# Patient Record
Sex: Female | Born: 1970 | Race: Black or African American | Hispanic: No | Marital: Single | Smoking: Current every day smoker
Health system: Southern US, Community
[De-identification: ages and names within clinical notes are randomized; demographics above are authoritative.]

## PROBLEM LIST (undated history)

## (undated) DIAGNOSIS — E079 Disorder of thyroid, unspecified: Secondary | ICD-10-CM

## (undated) DIAGNOSIS — E1165 Type 2 diabetes mellitus with hyperglycemia: Secondary | ICD-10-CM

## (undated) DIAGNOSIS — D649 Anemia, unspecified: Secondary | ICD-10-CM

## (undated) DIAGNOSIS — C801 Malignant (primary) neoplasm, unspecified: Secondary | ICD-10-CM

## (undated) DIAGNOSIS — R51 Headache: Secondary | ICD-10-CM

## (undated) DIAGNOSIS — I10 Essential (primary) hypertension: Secondary | ICD-10-CM

## (undated) DIAGNOSIS — R519 Headache, unspecified: Secondary | ICD-10-CM

## (undated) HISTORY — PX: OOPHORECTOMY: SHX86

## (undated) HISTORY — PX: TUBAL LIGATION: SHX77

## (undated) HISTORY — DX: Type 2 diabetes mellitus with hyperglycemia: E11.65

## (undated) HISTORY — PX: THYROIDECTOMY: SHX17

## (undated) HISTORY — PX: ABDOMINAL HYSTERECTOMY: SHX81

---

## 2010-07-14 DIAGNOSIS — C73 Malignant neoplasm of thyroid gland: Secondary | ICD-10-CM | POA: Insufficient documentation

## 2016-09-21 ENCOUNTER — Emergency Department (HOSPITAL_BASED_OUTPATIENT_CLINIC_OR_DEPARTMENT_OTHER): Payer: 59

## 2016-09-21 ENCOUNTER — Encounter (HOSPITAL_BASED_OUTPATIENT_CLINIC_OR_DEPARTMENT_OTHER): Payer: Self-pay | Admitting: Emergency Medicine

## 2016-09-21 ENCOUNTER — Emergency Department (HOSPITAL_BASED_OUTPATIENT_CLINIC_OR_DEPARTMENT_OTHER)
Admission: EM | Admit: 2016-09-21 | Discharge: 2016-09-21 | Disposition: A | Discharge status: Home / Self Care | Payer: 59 | Attending: Emergency Medicine | Admitting: Emergency Medicine

## 2016-09-21 DIAGNOSIS — D259 Leiomyoma of uterus, unspecified: Secondary | ICD-10-CM

## 2016-09-21 DIAGNOSIS — Z85818 Personal history of malignant neoplasm of other sites of lip, oral cavity, and pharynx: Secondary | ICD-10-CM | POA: Insufficient documentation

## 2016-09-21 DIAGNOSIS — F17200 Nicotine dependence, unspecified, uncomplicated: Secondary | ICD-10-CM | POA: Insufficient documentation

## 2016-09-21 DIAGNOSIS — Z79899 Other long term (current) drug therapy: Secondary | ICD-10-CM | POA: Insufficient documentation

## 2016-09-21 DIAGNOSIS — R109 Unspecified abdominal pain: Secondary | ICD-10-CM

## 2016-09-21 DIAGNOSIS — R1011 Right upper quadrant pain: Secondary | ICD-10-CM | POA: Diagnosis present

## 2016-09-21 HISTORY — DX: Disorder of thyroid, unspecified: E07.9

## 2016-09-21 HISTORY — DX: Malignant (primary) neoplasm, unspecified: C80.1

## 2016-09-21 HISTORY — DX: Anemia, unspecified: D64.9

## 2016-09-21 LAB — COMPREHENSIVE METABOLIC PANEL
ALK PHOS: 51 U/L (ref 38–126)
ALT: 11 U/L — ABNORMAL LOW (ref 14–54)
AST: 17 U/L (ref 15–41)
Albumin: 3.9 g/dL (ref 3.5–5.0)
Anion gap: 6 (ref 5–15)
BILIRUBIN TOTAL: 0.5 mg/dL (ref 0.3–1.2)
BUN: 7 mg/dL (ref 6–20)
CALCIUM: 8.6 mg/dL — AB (ref 8.9–10.3)
CO2: 25 mmol/L (ref 22–32)
Chloride: 105 mmol/L (ref 101–111)
Creatinine, Ser: 0.77 mg/dL (ref 0.44–1.00)
GFR calc Af Amer: >60 mL/min (ref >60)
Glucose, Bld: 100 mg/dL — ABNORMAL HIGH (ref 65–99)
POTASSIUM: 3.8 mmol/L (ref 3.5–5.1)
Sodium: 136 mmol/L (ref 135–145)
Total Protein: 7.7 g/dL (ref 6.5–8.1)

## 2016-09-21 LAB — CBC WITH DIFFERENTIAL/PLATELET
BASOS ABS: 0 10*3/uL (ref 0.0–0.1)
Basophils Relative: 0 %
Eosinophils Absolute: 0.1 10*3/uL (ref 0.0–0.7)
Eosinophils Relative: 1 %
HEMATOCRIT: 31.7 % — AB (ref 36.0–46.0)
HEMOGLOBIN: 10.2 g/dL — AB (ref 12.0–15.0)
LYMPHS PCT: 15 %
Lymphs Abs: 1.4 10*3/uL (ref 0.7–4.0)
MCH: 25.8 pg — ABNORMAL LOW (ref 26.0–34.0)
MCHC: 32.2 g/dL (ref 30.0–36.0)
MCV: 80.3 fL (ref 78.0–100.0)
MONO ABS: 0.6 10*3/uL (ref 0.1–1.0)
MONOS PCT: 7 %
NEUTROS ABS: 7.2 10*3/uL (ref 1.7–7.7)
Neutrophils Relative %: 77 %
Platelets: 334 10*3/uL (ref 150–400)
RBC: 3.95 MIL/uL (ref 3.87–5.11)
RDW: 20.5 % — AB (ref 11.5–15.5)
WBC: 9.3 10*3/uL (ref 4.0–10.5)

## 2016-09-21 LAB — URINALYSIS, ROUTINE W REFLEX MICROSCOPIC
BILIRUBIN URINE: NEGATIVE
GLUCOSE, UA: NEGATIVE mg/dL
KETONES UR: NEGATIVE mg/dL
LEUKOCYTES UA: NEGATIVE
Nitrite: NEGATIVE
PROTEIN: NEGATIVE mg/dL
Specific Gravity, Urine: 1.012 (ref 1.005–1.030)
pH: 7 (ref 5.0–8.0)

## 2016-09-21 LAB — LIPASE, BLOOD: LIPASE: 23 U/L (ref 11–51)

## 2016-09-21 LAB — URINE MICROSCOPIC-ADD ON

## 2016-09-21 LAB — PREGNANCY, URINE: Preg Test, Ur: NEGATIVE

## 2016-09-21 MED ORDER — FENTANYL CITRATE (PF) 100 MCG/2ML IJ SOLN
50.0000 ug | Freq: Once | INTRAMUSCULAR | Status: AC
  Administered 2016-09-21: 50 ug via INTRAVENOUS
  Filled 2016-09-21: qty 2

## 2016-09-21 MED ORDER — FENTANYL CITRATE (PF) 100 MCG/2ML IJ SOLN
INTRAMUSCULAR | Status: AC
  Administered 2016-09-21: 50 ug
  Filled 2016-09-21: qty 2

## 2016-09-21 MED ORDER — ONDANSETRON HCL 4 MG/2ML IJ SOLN
4.0000 mg | Freq: Once | INTRAMUSCULAR | Status: AC
  Administered 2016-09-21: 4 mg via INTRAVENOUS
  Filled 2016-09-21: qty 2

## 2016-09-21 MED ORDER — HYDROCODONE-ACETAMINOPHEN 5-325 MG PO TABS: 1.0000 | ORAL_TABLET | Freq: Four times a day (QID) | ORAL | 0 refills | Status: DC | PRN

## 2016-09-21 NOTE — ED Triage Notes (Signed)
Low abd pain started yesterday morning and has moved up during the day until now it is upper abdomen.  Denies N/V/D.  Aching pain.

## 2016-09-21 NOTE — ED Provider Notes (Signed)
[facility] Provider Note: Georgena Spurling, MD, FACEP  CSN: [id number] MRN: [id number] ARRIVAL: 09/21/16 at [place]: [name]  Abdominal Pain   HISTORY OF PRESENT ILLNESS  Jenna Nichols is a 45 y.o. female with a history of thyroid cancer status post thyroidectomy in 2002 as well as chronic anemia. She is here with abdominal pain that began yesterday. The pain began diffusely but has localized to the upper abdomen. It is most prominent in the epigastrium and right upper quadrant. She describes it as an ache now but previously it felt like a combination of gas pains and menstrual cramps. She rates it as an 8 out of 10. It is worse standing then lying down. It is worse with movement or palpation. She denies fever, nausea, vomiting, diarrhea, vaginal bleeding, vaginal discharge, dysuria or hematuria. She states she constantly feels cold due to her anemia and admits to eating ice frequently.   Past Medical History:  Diagnosis Date  . Anemia   . Cancer ([place])   . Thyroid disease     Past Surgical History:  Procedure Laterality Date  . THYROIDECTOMY    . TUBAL LIGATION      No family history on file.  Social History  Substance Use Topics  . Smoking status: Current Every Day Smoker    Packs/day: 1.00  . Smokeless tobacco: Never Used  . Alcohol use Yes     Comment: occ    Prior to Admission medications   Medication Sig Start Date End Date Taking? Authorizing Provider  Levothyroxine Sodium (SYNTHROID PO) Take by mouth.   Yes Historical Provider, MD    Allergies Patient has no known allergies.   REVIEW OF SYSTEMS  Negative except as noted here or in the History of Present Illness.   PHYSICAL EXAMINATION  Initial Vital Signs Blood pressure 173/81, pulse 67, temperature 98.4 F (36.9 C), temperature source Oral, resp. rate 16, height 5' 7.25" (1.708 m), weight 165 lb (74.8 kg), last menstrual period 08/30/2016, SpO2 100  %.  Examination General: Well-developed, well-nourished female in no acute distress; appearance consistent with age of record HENT: normocephalic; atraumatic Eyes: pupils equal, round and reactive to light; extraocular muscles intact Neck: supple Heart: regular rate and rhythm Lungs: clear to auscultation bilaterally Abdomen: soft; nondistended; Epigastric and right upper quadrant tenderness; no masses or hepatosplenomegaly; bowel sounds present Extremities: No deformity; full range of motion; pulses normal Neurologic: Awake, alert and oriented; motor function intact in all extremities and symmetric; no facial droop Skin: Warm and dry Psychiatric: Normal mood and affect   RESULTS  Summary of this visit's results, reviewed by myself:   EKG Interpretation  Date/Time:    Ventricular Rate:    PR Interval:    QRS Duration:   QT Interval:    QTC Calculation:   R Axis:     Text Interpretation:        Laboratory Studies: Results for orders placed or performed during the hospital encounter of 09/21/16 (from the past 24 hour(s))  CBC with Differential/Platelet     Status: Abnormal   Collection Time: 09/21/16  2:45 AM  Result Value Ref Range   WBC 9.3 4.0 - 10.5 K/uL   RBC 3.95 3.87 - 5.11 MIL/uL   Hemoglobin 10.2 (L) 12.0 - 15.0 g/dL   HCT 31.7 (L) 36.0 - 46.0 %   MCV 80.3 78.0 - 100.0 fL   MCH 25.8 (L) 26.0 - 34.0 pg   MCHC 32.2 30.0 -  36.0 g/dL   RDW 20.5 (H) 11.5 - 15.5 %   Platelets 334 150 - 400 K/uL   Neutrophils Relative % 77 %   Neutro Abs 7.2 1.7 - 7.7 K/uL   Lymphocytes Relative 15 %   Lymphs Abs 1.4 0.7 - 4.0 K/uL   Monocytes Relative 7 %   Monocytes Absolute 0.6 0.1 - 1.0 K/uL   Eosinophils Relative 1 %   Eosinophils Absolute 0.1 0.0 - 0.7 K/uL   Basophils Relative 0 %   Basophils Absolute 0.0 0.0 - 0.1 K/uL  Comprehensive metabolic panel     Status: Abnormal   Collection Time: 09/21/16  2:45 AM  Result Value Ref Range   Sodium 136 135 - 145 mmol/L    Potassium 3.8 3.5 - 5.1 mmol/L   Chloride 105 101 - 111 mmol/L   CO2 25 22 - 32 mmol/L   Glucose, Bld 100 (H) 65 - 99 mg/dL   BUN 7 6 - 20 mg/dL   Creatinine, Ser 0.77 0.44 - 1.00 mg/dL   Calcium 8.6 (L) 8.9 - 10.3 mg/dL   Total Protein 7.7 6.5 - 8.1 g/dL   Albumin 3.9 3.5 - 5.0 g/dL   AST 17 15 - 41 U/L   ALT 11 (L) 14 - 54 U/L   Alkaline Phosphatase 51 38 - 126 U/L   Total Bilirubin 0.5 0.3 - 1.2 mg/dL   GFR calc non Af Amer >60 >60 mL/min   GFR calc Af Amer >60 >60 mL/min   Anion gap 6 5 - 15  Lipase, blood     Status: None   Collection Time: 09/21/16  2:45 AM  Result Value Ref Range   Lipase 23 11 - 51 U/L  Urinalysis, Routine w reflex microscopic (not at [facility])     Status: Abnormal   Collection Time: 09/21/16  2:50 AM  Result Value Ref Range   Color, Urine YELLOW YELLOW   APPearance CLEAR CLEAR   Specific Gravity, Urine 1.012 1.005 - 1.030   pH 7.0 5.0 - 8.0   Glucose, UA NEGATIVE NEGATIVE mg/dL   Hgb urine dipstick MODERATE (A) NEGATIVE   Bilirubin Urine NEGATIVE NEGATIVE   Ketones, ur NEGATIVE NEGATIVE mg/dL   Protein, ur NEGATIVE NEGATIVE mg/dL   Nitrite NEGATIVE NEGATIVE   Leukocytes, UA NEGATIVE NEGATIVE  Pregnancy, urine     Status: None   Collection Time: 09/21/16  2:50 AM  Result Value Ref Range   Preg Test, Ur NEGATIVE NEGATIVE  Urine microscopic-add on     Status: Abnormal   Collection Time: 09/21/16  2:50 AM  Result Value Ref Range   Squamous Epithelial / LPF 0-5 (A) NONE SEEN   WBC, UA 0-5 0 - 5 WBC/hpf   RBC / HPF 6-30 0 - 5 RBC/hpf   Bacteria, UA RARE (A) NONE SEEN   Imaging Studies: Ct Renal Stone Study  Result Date: 09/21/2016 CLINICAL DATA:  Diffuse abdominal pain for 17 hours.  Hematuria. EXAM: CT ABDOMEN AND PELVIS WITHOUT CONTRAST TECHNIQUE: Multidetector CT imaging of the abdomen and pelvis was performed following the standard protocol without IV contrast. COMPARISON:  None. FINDINGS: Lower chest: Mild dependent changes in the lung bases.  Hepatobiliary: No focal liver abnormality is seen. No gallstones, gallbladder wall thickening, or biliary dilatation. Pancreas: Unremarkable. No pancreatic ductal dilatation or surrounding inflammatory changes. Spleen: Normal in size without focal abnormality. Adrenals/Urinary Tract: No adrenal gland nodules. No hydronephrosis or hydroureter. No renal or ureteral stones. Bladder is decompressed, limiting evaluation  but no specific filling defect or wall thickening is appreciated. Stomach/Bowel: Stomach is within normal limits. Appendix appears normal. No evidence of bowel wall thickening, distention, or inflammatory changes. Vascular/Lymphatic: No significant vascular findings are present. No enlarged abdominal or pelvic lymph nodes. Reproductive: Nodular uterine enlargement consistent with uterine fibroids. No abnormal adnexal masses. Other: No abdominal wall hernia or abnormality. No abdominopelvic ascites. Musculoskeletal: Degenerative changes in the spine. No destructive bone lesions. Partial compression of L1 vertebra, appears chronic. IMPRESSION: No renal or ureteral stone or obstruction demonstrated. No acute process demonstrated in the abdomen and pelvis on noncontrast imaging. Electronically Signed   By: Lucienne Capers M.D.   On: 09/21/2016 03:50    ED COURSE  Nursing notes and initial vitals signs, including pulse oximetry, reviewed.  Vitals:   09/21/16 0217 09/21/16 0218  BP: 173/81   Pulse: 67   Resp: 16   Temp: 98.4 F (36.9 C)   TempSrc: Oral   SpO2: 100%   Weight:  165 lb (74.8 kg)  Height:  5' 7.25" (1.708 m)   3:22 AM Patient's right upper quadrant pain and tenderness have abated. She is now having pain and tenderness in her right lower quadrant and suprapubic region.  5:05 AM Patient is pain-free at this time. There is no tenderness of the abdomen. She was advised of her laboratory and CT findings. The blood in your urine may represent a passed stone but may be contamination  from early menses. She has a history of dysmenorrhea and was unaware of the presence of uterine fibroids. She is requesting referral to an OB/GYN.  PROCEDURES    ED DIAGNOSES     ICD-9-CM ICD-10-CM   1. Abdominal pain in female 789.00 R10.9   2. Uterine leiomyoma, unspecified location 218.9 D25.9        Shanon Rosser, MD 09/21/16 [phone]

## 2016-09-21 NOTE — ED Notes (Signed)
ED Provider at bedside. 

## 2016-10-17 ENCOUNTER — Ambulatory Visit (INDEPENDENT_AMBULATORY_CARE_PROVIDER_SITE_OTHER): Payer: 59 | Admitting: Family Medicine

## 2016-10-17 ENCOUNTER — Ambulatory Visit (HOSPITAL_BASED_OUTPATIENT_CLINIC_OR_DEPARTMENT_OTHER)
Admission: RE | Admit: 2016-10-17 | Discharge: 2016-10-17 | Disposition: A | Discharge status: Home / Self Care | Payer: 59 | Source: Ambulatory Visit | Attending: Family Medicine | Admitting: Family Medicine

## 2016-10-17 ENCOUNTER — Other Ambulatory Visit (HOSPITAL_BASED_OUTPATIENT_CLINIC_OR_DEPARTMENT_OTHER): Payer: 59

## 2016-10-17 ENCOUNTER — Encounter: Payer: Self-pay | Admitting: Family Medicine

## 2016-10-17 VITALS — BP 148/88 | HR 88 | Ht 65.0 in | Wt 175.0 lb

## 2016-10-17 DIAGNOSIS — D259 Leiomyoma of uterus, unspecified: Secondary | ICD-10-CM | POA: Insufficient documentation

## 2016-10-17 DIAGNOSIS — I10 Essential (primary) hypertension: Secondary | ICD-10-CM | POA: Diagnosis not present

## 2016-10-17 NOTE — Progress Notes (Signed)
   Subjective:    Patient ID: Jenna Nichols, female    DOB: 08/21/1971, 45 y.o.   MRN: [id number]  HPI Patient referred to our office by [facility] ED for fibroids and dysmenorrhea. Patient has periods every 28-30 days. Flow lasts for 5 days with 2 days of heavy flow, during which she uses both a tampon and a heavy pad. She usually has severe dysmenorrhea with her menses. She is sexually active. No abnormal discharge. Denies pelvic pain outside of menses. No palliating or provoking factors - takes NSAIDs, which help.   I have reviewed the patients past medical, family, and social history.  I have reviewed the patient's medication list and allergies.   Review of Systems  All other systems reviewed and are negative.      Objective:   Physical Exam  Constitutional: She is oriented to person, place, and time. She appears well-developed and well-nourished.  HENT:  Head: Normocephalic and atraumatic.  Right Ear: External ear normal.  Left Ear: External ear normal.  Cardiovascular: Normal rate, regular rhythm and normal heart sounds.  Exam reveals no gallop and no friction rub.   No murmur heard. Pulmonary/Chest: Effort normal and breath sounds normal. No respiratory distress. She has no wheezes. She has no rales. She exhibits no tenderness.  Abdominal: Soft. She exhibits no distension. There is no tenderness. There is no rebound. Hernia confirmed negative in the right inguinal area and confirmed negative in the left inguinal area.  Genitourinary: There is no rash, tenderness, lesion or injury on the right labia. There is no rash, tenderness, lesion or injury on the left labia. Uterus is enlarged (about 10 week size). Cervix exhibits no motion tenderness, no discharge and no friability. Right adnexum displays no mass, no tenderness and no fullness. Left adnexum displays no mass, no tenderness and no fullness. No erythema, tenderness or bleeding in the vagina. No foreign body in the vagina. No signs of  injury around the vagina. No vaginal discharge found.  Neurological: She is alert and oriented to person, place, and time.      Assessment & Plan:  1. Uterine leiomyoma, unspecified location Will check [place]. Patient to return in 2-3 weeks for follow up of [place] and to discuss plan. Will do annual exam at that time. - US Pelvis Complete; Future - US Transvaginal Non-OB; Future  2. Essential hypertension If elevated at next appt, should start medication.

## 2016-11-08 ENCOUNTER — Ambulatory Visit (INDEPENDENT_AMBULATORY_CARE_PROVIDER_SITE_OTHER): Payer: 59 | Admitting: Family Medicine

## 2016-11-08 ENCOUNTER — Encounter: Payer: Self-pay | Admitting: Family Medicine

## 2016-11-08 VITALS — BP 150/92 | Ht 65.0 in | Wt 178.0 lb

## 2016-11-08 DIAGNOSIS — Z0000 Encounter for general adult medical examination without abnormal findings: Secondary | ICD-10-CM | POA: Diagnosis not present

## 2016-11-08 DIAGNOSIS — Z1151 Encounter for screening for human papillomavirus (HPV): Secondary | ICD-10-CM | POA: Diagnosis not present

## 2016-11-08 DIAGNOSIS — I10 Essential (primary) hypertension: Secondary | ICD-10-CM

## 2016-11-08 DIAGNOSIS — Z124 Encounter for screening for malignant neoplasm of cervix: Secondary | ICD-10-CM | POA: Diagnosis not present

## 2016-11-08 DIAGNOSIS — D259 Leiomyoma of uterus, unspecified: Secondary | ICD-10-CM

## 2016-11-08 DIAGNOSIS — Z01419 Encounter for gynecological examination (general) (routine) without abnormal findings: Secondary | ICD-10-CM | POA: Diagnosis not present

## 2016-11-08 MED ORDER — MEGESTROL ACETATE 40 MG PO TABS: 80.0000 mg | ORAL_TABLET | Freq: Every day | ORAL | 5 refills | Status: DC

## 2016-11-08 MED ORDER — ENALAPRIL MALEATE 10 MG PO TABS: 10.0000 mg | ORAL_TABLET | Freq: Every day | ORAL | 3 refills | Status: DC

## 2016-11-08 NOTE — Patient Instructions (Signed)

## 2016-11-08 NOTE — Addendum Note (Signed)
Addended by: Lyndal Rainbow on: 11/08/2016 09:17 AM   Modules accepted: Orders

## 2016-11-08 NOTE — Progress Notes (Signed)
Subjective:     Jenna Nichols is a 45 y.o. female and is here for a comprehensive physical exam. The patient reports no problems.  Social History   Social History  . Marital status: Single    Spouse name: N/A  . Number of children: N/A  . Years of education: N/A   Occupational History  . Not on file.   Social History Main Topics  . Smoking status: Current Every Day Smoker    Packs/day: 1.00  . Smokeless tobacco: Never Used  . Alcohol use Yes     Comment: occ  . Drug use:     Types: Marijuana     Comment: Sts she smokes Marijuana "every day"  . Sexual activity: Not Currently    Birth control/ protection: Surgical   Other Topics Concern  . Not on file   Social History Narrative  . No narrative on file   Health Maintenance  Topic Date Due  . HIV Screening  09/17/1984  . TETANUS/TDAP  09/17/1988  . PAP SMEAR  09/17/1990  . INFLUENZA VACCINE  06/12/2016    The following portions of the patient's history were reviewed and updated as appropriate: allergies, current medications, past family history, past medical history, past social history, past surgical history and problem list.  Review of Systems Pertinent items are noted in HPI.   Objective:   General Appearance:    Alert, cooperative, no distress, appears stated age  Head:    Normocephalic, without obvious abnormality, atraumatic  Eyes:    PERRL, conjunctiva/corneas clear, EOM's intact, fundi    benign, both eyes  Neck:   Supple, symmetrical, trachea midline, no adenopathy;    thyroid:  no enlargement/tenderness/nodules; no carotid   bruit or JVD  Back:     Symmetric, no curvature, ROM normal, no CVA tenderness  Lungs:     Clear to auscultation bilaterally, respirations unlabored  Chest Wall:    No tenderness or deformity   Heart:    Regular rate and rhythm, S1 and S2 normal, no murmur, rub   or gallop  Breast Exam:    No tenderness, masses, or nipple abnormality  Abdomen:     Soft, non-tender, bowel sounds  active all four quadrants,    no masses, no organomegaly  Genitalia:    Normal female without lesion, discharge or tenderness.         Vaginal rugae normal.  Cervix normal in appearance.  Uterus   normal in size.  Adnexa normal, no masses or fullness   palpated.  Extremities:   Extremities normal, atraumatic, no cyanosis or edema  Pulses:   2+ and symmetric all extremities  Skin:   Skin color, texture, turgor normal, no rashes or lesions  Lymph nodes:   Cervical, supraclavicular, and axillary nodes normal  Neurologic:   CNII-XII intact, normal strength, sensation and reflexes    throughout     Assessment:       Plan:      1. Well woman exam with routine gynecological exam PAP done. Discussed diet, exercise - MM DIGITAL SCREENING BILATERAL; Future  2. Uterine leiomyoma, unspecified location Discussed options. Patient would like hysterectomy. Will have pt seen [employer] officer, [employer]. Start on megace to control bleeding until patient able to be seen.  3. Essential hypertension Start vasotec. Discussed smoking cessation, dietary and lifestyle modifications.  See After Visit Summary for Counseling Recommendations

## 2016-11-13 ENCOUNTER — Ambulatory Visit (HOSPITAL_BASED_OUTPATIENT_CLINIC_OR_DEPARTMENT_OTHER)
Admission: RE | Admit: 2016-11-13 | Discharge: 2016-11-13 | Disposition: A | Discharge status: Home / Self Care | Payer: 59 | Source: Ambulatory Visit | Attending: Family Medicine | Admitting: Family Medicine

## 2016-11-13 DIAGNOSIS — Z01419 Encounter for gynecological examination (general) (routine) without abnormal findings: Secondary | ICD-10-CM

## 2016-11-13 DIAGNOSIS — Z1231 Encounter for screening mammogram for malignant neoplasm of breast: Secondary | ICD-10-CM | POA: Insufficient documentation

## 2016-11-13 LAB — CYTOLOGY - PAP
ADEQUACY: ABSENT
DIAGNOSIS: NEGATIVE
HPV: NOT DETECTED

## 2016-11-14 ENCOUNTER — Other Ambulatory Visit: Payer: Self-pay | Admitting: Family Medicine

## 2016-11-14 DIAGNOSIS — R928 Other abnormal and inconclusive findings on diagnostic imaging of breast: Secondary | ICD-10-CM

## 2016-11-14 MED ORDER — METRONIDAZOLE 500 MG PO TABS: 500.0000 mg | ORAL_TABLET | Freq: Two times a day (BID) | ORAL | 0 refills | Status: DC

## 2016-11-15 ENCOUNTER — Telehealth: Payer: Self-pay

## 2016-11-15 NOTE — Telephone Encounter (Signed)
Left message for patient to return call to office.  Jennifer Howard RNBSN 

## 2016-11-15 NOTE — Telephone Encounter (Signed)
-----   Message from Truett Mainland, DO sent at 11/14/2016  8:27 AM EST ----- Has trichomonas. Please let her know. Flagyl sent to pharmacy: 500mg  BID x7 days. Partner needs treatment as well.

## 2016-11-19 ENCOUNTER — Other Ambulatory Visit: Payer: 59

## 2016-11-22 ENCOUNTER — Other Ambulatory Visit: Payer: Self-pay | Admitting: Family Medicine

## 2016-11-22 ENCOUNTER — Ambulatory Visit
Admission: RE | Admit: 2016-11-22 | Discharge: 2016-11-22 | Disposition: A | Discharge status: Home / Self Care | Payer: 59 | Source: Ambulatory Visit | Attending: Family Medicine | Admitting: Family Medicine

## 2016-11-22 DIAGNOSIS — R599 Enlarged lymph nodes, unspecified: Secondary | ICD-10-CM

## 2016-11-22 DIAGNOSIS — N6310 Unspecified lump in the right breast, unspecified quadrant: Secondary | ICD-10-CM

## 2016-11-22 DIAGNOSIS — R928 Other abnormal and inconclusive findings on diagnostic imaging of breast: Secondary | ICD-10-CM

## 2016-11-23 ENCOUNTER — Institutional Professional Consult (permissible substitution): Payer: 59 | Admitting: Obstetrics & Gynecology

## 2016-11-23 DIAGNOSIS — Z01419 Encounter for gynecological examination (general) (routine) without abnormal findings: Secondary | ICD-10-CM

## 2016-11-29 ENCOUNTER — Inpatient Hospital Stay: Admission: RE | Admit: 2016-11-29 | Payer: 59 | Source: Ambulatory Visit

## 2016-11-29 ENCOUNTER — Other Ambulatory Visit: Payer: 59

## 2016-12-03 ENCOUNTER — Other Ambulatory Visit: Payer: Self-pay | Admitting: Family Medicine

## 2016-12-03 ENCOUNTER — Ambulatory Visit
Admission: RE | Admit: 2016-12-03 | Discharge: 2016-12-03 | Disposition: A | Discharge status: Home / Self Care | Payer: 59 | Source: Ambulatory Visit | Attending: Family Medicine | Admitting: Family Medicine

## 2016-12-03 DIAGNOSIS — R599 Enlarged lymph nodes, unspecified: Secondary | ICD-10-CM

## 2016-12-03 DIAGNOSIS — N6310 Unspecified lump in the right breast, unspecified quadrant: Secondary | ICD-10-CM

## 2017-06-11 ENCOUNTER — Ambulatory Visit: Payer: 59 | Admitting: Certified Nurse Midwife

## 2017-06-11 ENCOUNTER — Encounter: Payer: Self-pay | Admitting: General Practice

## 2017-06-11 NOTE — Progress Notes (Unsigned)
Patient no showed for appt. Can reschedule on her own

## 2017-11-15 ENCOUNTER — Encounter: Payer: Self-pay | Admitting: Family Medicine

## 2017-11-15 ENCOUNTER — Ambulatory Visit (INDEPENDENT_AMBULATORY_CARE_PROVIDER_SITE_OTHER): Payer: 59 | Admitting: Family Medicine

## 2017-11-15 VITALS — BP 156/90 | HR 82 | Ht 67.0 in | Wt 175.0 lb

## 2017-11-15 DIAGNOSIS — Z113 Encounter for screening for infections with a predominantly sexual mode of transmission: Secondary | ICD-10-CM

## 2017-11-15 DIAGNOSIS — N946 Dysmenorrhea, unspecified: Secondary | ICD-10-CM

## 2017-11-15 MED ORDER — NORETHINDRONE 0.35 MG PO TABS
1.0000 | ORAL_TABLET | Freq: Every day | ORAL | 11 refills | Status: DC
Start: 2017-11-15 — End: 2018-06-17

## 2017-11-15 MED ORDER — IBUPROFEN 800 MG PO TABS: 800.0000 mg | ORAL_TABLET | Freq: Three times a day (TID) | ORAL | 3 refills | Status: DC | PRN

## 2017-11-15 MED FILL — IBUPROFEN 800 MG TAB: 800 | 10 days supply | Qty: 30 | Fill #0

## 2017-11-15 MED FILL — NORETHINDRONE 0.35 MG TAB: 0.35 | 28 days supply | Qty: 28 | Fill #0

## 2017-11-15 NOTE — Progress Notes (Signed)
Patient complaining of lower abdominal pain before her period. Kathrene Alu RN

## 2017-11-15 NOTE — Progress Notes (Signed)
   Subjective:    Patient ID: Jenna Nichols, female    DOB: [birth year], 47 y.o.   MRN: [id number]  HPI Patient seen for pelvic cramping that start about a week prior to the start of her menses. Started about 2-3 months ago. Initially started 1-2 days prior to start of menses, but has increased to 7 days prior. Had [place] last year, showing 3 intramural fibroids. 2-4cm in size. Menses a little heavier and longer.  Also wants to be tested for STDs.  Review of Systems     Objective:   Physical Exam  Constitutional: She appears well-developed and well-nourished.  Abdominal: Hernia confirmed negative in the right inguinal area and confirmed negative in the left inguinal area.  Genitourinary: There is no rash, tenderness, lesion or injury on the right labia. There is no rash, tenderness, lesion or injury on the left labia. Uterus is enlarged (6wk size). Uterus is not deviated, not fixed and not tender. Cervix exhibits no motion tenderness, no discharge and no friability. Right adnexum displays no mass and no tenderness. Left adnexum displays no mass, no tenderness and no fullness. No erythema, tenderness or bleeding in the vagina. No foreign body in the vagina. No signs of injury around the vagina. No vaginal discharge found.  Lymphadenopathy:       Right: No inguinal adenopathy present.       Left: No inguinal adenopathy present.      Assessment & Plan:  1. Dysmenorrhea Discussed treatment options - NSAIDs, progesterone, vs surgery. Patient not at a point where she can afford to take time off for surgery. Will treat with NSAIDs and progesterone - will start with micronor and increase if needed.  2. Screening for STD (sexually transmitted disease) Patient having blood work done at endocrinology next week. Lab slip sent for HIV, HepB/C, RPR. - Cervicovaginal ancillary only

## 2017-11-19 ENCOUNTER — Other Ambulatory Visit: Payer: Self-pay | Admitting: Family Medicine

## 2017-11-19 ENCOUNTER — Telehealth: Payer: Self-pay

## 2017-11-19 LAB — CERVICOVAGINAL ANCILLARY ONLY
Bacterial vaginitis: POSITIVE — AB
CANDIDA VAGINITIS: NEGATIVE
Chlamydia: NEGATIVE
NEISSERIA GONORRHEA: NEGATIVE
TRICH (WINDOWPATH): POSITIVE — AB

## 2017-11-19 MED ORDER — METRONIDAZOLE 500 MG PO TABS: 500.0000 mg | ORAL_TABLET | Freq: Two times a day (BID) | ORAL | 0 refills | Status: DC

## 2017-11-19 NOTE — Telephone Encounter (Signed)
Patient called and made aware that she has bacterial vaginosis and trichomonas. Patient made aware that she has an antibiotic for treatment at her pharmacy. Made patient aware that she needs to avoid alcohol with taking this prescription. Patient also made aware that partner/ vibrators needs to be tested and treated/sterilized before having intercourse.   Patient states understanding. Jenna Nichols RNBSN

## 2017-11-19 NOTE — Telephone Encounter (Signed)
-----   Message from Truett Mainland, DO sent at 11/19/2017  4:23 PM EST ----- Pt has BV and Trich. No sex for 2 weeks. Partners need to be treated and vibrators, etc need to be sterilized. Flagyl sent to pharmacy: 500mg  BID x 7 days. Please finish antibiotics and don't drink alcohol while on antibiotics. Please notify patient.

## 2017-11-21 DIAGNOSIS — C73 Malignant neoplasm of thyroid gland: Secondary | ICD-10-CM | POA: Diagnosis not present

## 2017-11-21 DIAGNOSIS — D508 Other iron deficiency anemias: Secondary | ICD-10-CM | POA: Diagnosis not present

## 2018-05-02 ENCOUNTER — Encounter: Payer: Self-pay | Admitting: Obstetrics & Gynecology

## 2018-05-02 ENCOUNTER — Other Ambulatory Visit (HOSPITAL_BASED_OUTPATIENT_CLINIC_OR_DEPARTMENT_OTHER): Payer: 59

## 2018-05-02 ENCOUNTER — Ambulatory Visit (INDEPENDENT_AMBULATORY_CARE_PROVIDER_SITE_OTHER): Payer: 59 | Admitting: Obstetrics & Gynecology

## 2018-05-02 VITALS — BP 155/95 | HR 100 | Ht 67.25 in | Wt 190.0 lb

## 2018-05-02 DIAGNOSIS — Z113 Encounter for screening for infections with a predominantly sexual mode of transmission: Secondary | ICD-10-CM | POA: Diagnosis not present

## 2018-05-02 DIAGNOSIS — F17200 Nicotine dependence, unspecified, uncomplicated: Secondary | ICD-10-CM | POA: Diagnosis not present

## 2018-05-02 DIAGNOSIS — R102 Pelvic and perineal pain: Secondary | ICD-10-CM | POA: Diagnosis not present

## 2018-05-02 DIAGNOSIS — N921 Excessive and frequent menstruation with irregular cycle: Secondary | ICD-10-CM | POA: Diagnosis not present

## 2018-05-02 DIAGNOSIS — D259 Leiomyoma of uterus, unspecified: Secondary | ICD-10-CM

## 2018-05-02 DIAGNOSIS — N946 Dysmenorrhea, unspecified: Secondary | ICD-10-CM

## 2018-05-02 NOTE — Progress Notes (Signed)
Patient states she knows she has fibroids but is now having cramping daily. Her periods are also really heavy. Kathrene Alu RN

## 2018-05-02 NOTE — Progress Notes (Signed)
Patient ID: Jenna Nichols, female   DOB: 01-08-1971, 47 y.o.   MRN: [id number]  Ccz; daily cramps, frequent heavy menses  HPI Jenna Nichols is a 47 y.o. female.  G3P3 No LMP recorded. She returns today noting no improvement with her cramps, heavy bleeding and her menses come every 3-4 weeks and last for 4-6 days. Ibuprofen does relieve her pain but she takes it daily. HPI  Past Medical History:  Diagnosis Date  . Anemia   . Cancer ([place])   . Thyroid disease     Past Surgical History:  Procedure Laterality Date  . THYROIDECTOMY    . TUBAL LIGATION      Family History  Problem Relation Age of Onset  . Diabetes Mother   . Cancer Brother     Social History Social History   Tobacco Use  . Smoking status: Current Every Day Smoker    Packs/day: 1.00  . Smokeless tobacco: Never Used  Substance Use Topics  . Alcohol use: Yes    Comment: occ  . Drug use: Yes    Types: Marijuana    Comment: Sts she smokes Marijuana "every day"    No Known Allergies  Current Outpatient Medications  Medication Sig Dispense Refill  . ibuprofen (ADVIL,MOTRIN) 800 MG tablet Take 1 tablet (800 mg total) by mouth 3 (three) times daily with meals as needed for headache or moderate pain. 30 tablet 3  . Levothyroxine Sodium (SYNTHROID PO) Take 137 mcg by mouth.     . enalapril (VASOTEC) 10 MG tablet Take 1 tablet (10 mg total) by mouth daily. (Patient not taking: Reported on 11/15/2017) 90 tablet 3  . HYDROcodone-acetaminophen (NORCO) 5-325 MG tablet Take 1 tablet by mouth every 6 (six) hours as needed for severe pain. (Patient not taking: Reported on 11/08/2016) 6 tablet 0  . megestrol (MEGACE) 40 MG tablet Take 2 tablets (80 mg total) by mouth daily. Can increase to two tablets twice a day in the event of heavy bleeding (Patient not taking: Reported on 11/15/2017) 60 tablet 5  . metroNIDAZOLE (FLAGYL) 500 MG tablet Take 1 tablet (500 mg total) by mouth 2 (two) times daily. (Patient not taking: Reported on  05/02/2018) 14 tablet 0  . norethindrone (MICRONOR,CAMILA,ERRIN) 0.35 MG tablet Take 1 tablet (0.35 mg total) by mouth daily. (Patient not taking: Reported on 05/02/2018) 1 Package 11   No current facility-administered medications for this visit.     Review of Systems Review of Systems  Constitutional: Positive for fatigue.  Respiratory: Negative.   Cardiovascular: Negative.   Gastrointestinal: Positive for abdominal distention and abdominal pain.  Genitourinary: Positive for menstrual problem and pelvic pain. Negative for vaginal discharge.    Blood pressure (!) 155/95, pulse 100, height 5' 7.25" (1.708 m), weight 190 lb (86.2 kg).  Physical Exam Physical Exam  Constitutional: She appears well-developed. No distress.  HENT:  Head: Normocephalic.  Pulmonary/Chest: Effort normal.  Psychiatric: She has a normal mood and affect. Her behavior is normal.    Data Reviewed CLINICAL DATA:  Patient with history of fibroids. Heavy menstrual cycles.  EXAM: TRANSABDOMINAL AND TRANSVAGINAL ULTRASOUND OF PELVIS  TECHNIQUE: Both transabdominal and transvaginal ultrasound examinations of the pelvis were performed. Transabdominal technique was performed for global imaging of the pelvis including uterus, ovaries, adnexal regions, and pelvic cul-de-sac. It was necessary to proceed with endovaginal exam following the transabdominal exam to visualize the endometrium.  COMPARISON:  None  FINDINGS: Uterus  Measurements: 11.4 x 6.7 x 7.6 cm. No fibroids  or other mass visualized.  Endometrium  Thickness: 1.2 mm. Multiple fibroids within the uterus including a 3.5 x 3.8 x 3.6 cm intramural fibroid within the anterior uterine body, a 2.4 x 2.4 x 2.5 cm intramural fibroid within the posterior uterine body and a 1.6 x 1.5 x 1.7 cm intramural fibroid within the uterine fundus.  Right ovary  Measurements: 3.5 x 2.2 x 2.6 cm. Normal appearance/no adnexal mass.  Left  ovary  Measurements: 3.2 x 2.1 x 2.7 cm. Within the left ovary there is a 1.2 x 1.1 x 1.3 cm probable complicated/ hemorrhagic cyst.  Other findings  No abnormal free fluid.  IMPRESSION: Multiple uterine fibroids.  Probable hemorrhagic cyst within the left ovary. Short-interval follow up ultrasound in 6-12 weeks is recommended, preferably during the week following the patient's normal menses.   Electronically Signed   By: Lovey Newcomer M.D.   On: 10/18/2016 08:23  CBC    Component Value Date/Time   WBC 9.3 09/21/2016 0245   RBC 3.95 09/21/2016 0245   HGB 10.2 (L) 09/21/2016 0245   HCT 31.7 (L) 09/21/2016 0245   PLT 334 09/21/2016 0245   MCV 80.3 09/21/2016 0245   MCH 25.8 (L) 09/21/2016 0245   MCHC 32.2 09/21/2016 0245   RDW 20.5 (H) 09/21/2016 0245   LYMPHSABS 1.4 09/21/2016 0245   MONOABS 0.6 09/21/2016 0245   EOSABS 0.1 09/21/2016 0245   BASOSABS 0.0 09/21/2016 0245      Assessment    Patient Active Problem List   Diagnosis Date Noted  . Dysmenorrhea 05/02/2018  . Menometrorrhagia 05/02/2018  . Uterine leiomyoma 10/17/2016  . Essential hypertension 10/17/2016  . Malignant neoplasm of thyroid gland ([place]) 07/14/2010  Smoker, urged to quit, spent 5 min discussing cessation      Plan    Repeat pelvic US, CBC, STD testing Continue ibuprofen prn Consider ablation, Mirena, or TVH RTC 4 weeks    Quit smoking information   Emeterio Reeve 05/02/2018, 9:07 AM

## 2018-05-02 NOTE — Patient Instructions (Addendum)
Uterine Fibroids Uterine fibroids are tissue masses (tumors) that can develop in the womb (uterus). They are also called leiomyomas. This type of tumor is not cancerous (benign) and does not spread to other parts of the body outside of the pelvic area, which is between the hip bones. Occasionally, fibroids may develop in the fallopian tubes, in the cervix, or on the support structures (ligaments) that surround the uterus. You can have one or many fibroids. Fibroids can vary in size, weight, and where they grow in the uterus. Some can become quite large. Most fibroids do not require medical treatment. What are the causes? A fibroid can develop when a single uterine cell keeps growing (replicating). Most cells in the human body have a control mechanism that keeps them from replicating without control. What are the signs or symptoms? Symptoms may include:  Heavy bleeding during your period.  Bleeding or spotting between periods.  Pelvic pain and pressure.  Bladder problems, such as needing to urinate more often (urinary frequency) or urgently.  Inability to reproduce offspring (infertility).  Miscarriages.  How is this diagnosed? Uterine fibroids are diagnosed through a physical exam. Your health care provider may feel the lumpy tumors during a pelvic exam. Ultrasonography and an MRI may be done to determine the size, location, and number of fibroids. How is this treated? Treatment may include:  Watchful waiting. This involves getting the fibroid checked by your health care provider to see if it grows or shrinks. Follow your health care provider's recommendations for how often to have this checked.  Hormone medicines. These can be taken by mouth or given through an intrauterine device (IUD).  Surgery. ? Removing the fibroids (myomectomy) or the uterus (hysterectomy). ? Removing blood supply to the fibroids (uterine artery embolization).  If fibroids interfere with your fertility and you  want to become pregnant, your health care provider may recommend having the fibroids removed. Follow these instructions at home:  Keep all follow-up visits as directed by your health care provider. This is important.  Take over-the-counter and prescription medicines only as told by your health care provider. ? If you were prescribed a hormone treatment, take the hormone medicines exactly as directed.  Ask your health care provider about taking iron pills and increasing the amount of dark green, leafy vegetables in your diet. These actions can help to boost your blood iron levels, which may be affected by heavy menstrual bleeding.  Pay close attention to your period and tell your health care provider about any changes, such as: ? Increased blood flow that requires you to use more pads or tampons than usual per month. ? A change in the number of days that your period lasts per month. ? A change in symptoms that are associated with your period, such as abdominal cramping or back pain. Contact a health care provider if:  You have pelvic pain, back pain, or abdominal cramps that cannot be controlled with medicines.  You have an increase in bleeding between and during periods.  You soak tampons or pads in a half hour or less.  You feel lightheaded, extra tired, or weak. Get help right away if:  You faint.  You have a sudden increase in pelvic pain. This information is not intended to replace advice given to you by your health care provider. Make sure you discuss any questions you have with your health care provider. Document Released: 10/26/2000 Document Revised: 06/28/2016 Document Reviewed: 04/27/2014 Elsevier Interactive Patient Education  2018 Elsevier Inc.    Steps to Quit Smoking Smoking tobacco can be bad for your health. It can also affect almost every organ in your body. Smoking puts you and people around you at risk for many serious long-lasting (chronic) diseases. Quitting  smoking is hard, but it is one of the best things that you can do for your health. It is never too late to quit. What are the benefits of quitting smoking? When you quit smoking, you lower your risk for getting serious diseases and conditions. They can include:  Lung cancer or lung disease.  Heart disease.  Stroke.  Heart attack.  Not being able to have children (infertility).  Weak bones (osteoporosis) and broken bones (fractures).  If you have coughing, wheezing, and shortness of breath, those symptoms may get better when you quit. You may also get sick less often. If you are pregnant, quitting smoking can help to lower your chances of having a baby of low birth weight. What can I do to help me quit smoking? Talk with your doctor about what can help you quit smoking. Some things you can do (strategies) include:  Quitting smoking totally, instead of slowly cutting back how much you smoke over a period of time.  Going to in-person counseling. You are more likely to quit if you go to many counseling sessions.  Using resources and support systems, such as: ? Database administrator with a Social worker. ? Phone quitlines. ? Careers information officer. ? Support groups or group counseling. ? Text messaging programs. ? Mobile phone apps or applications.  Taking medicines. Some of these medicines may have nicotine in them. If you are pregnant or breastfeeding, do not take any medicines to quit smoking unless your doctor says it is okay. Talk with your doctor about counseling or other things that can help you.  Talk with your doctor about using more than one strategy at the same time, such as taking medicines while you are also going to in-person counseling. This can help make quitting easier. What things can I do to make it easier to quit? Quitting smoking might feel very hard at first, but there is a lot that you can do to make it easier. Take these steps:  Talk to your family and friends. Ask  them to support and encourage you.  Call phone quitlines, reach out to support groups, or work with a Social worker.  Ask people who smoke to not smoke around you.  Avoid places that make you want (trigger) to smoke, such as: ? Bars. ? Parties. ? Smoke-break areas at work.  Spend time with people who do not smoke.  Lower the stress in your life. Stress can make you want to smoke. Try these things to help your stress: ? Getting regular exercise. ? Deep-breathing exercises. ? Yoga. ? Meditating. ? Doing a body scan. To do this, close your eyes, focus on one area of your body at a time from head to toe, and notice which parts of your body are tense. Try to relax the muscles in those areas.  Download or buy apps on your mobile phone or tablet that can help you stick to your quit plan. There are many free apps, such as QuitGuide from the [employer] Office manager for Disease Control and Prevention). You can find more support from smokefree.gov and other websites.  This information is not intended to replace advice given to you by your health care provider. Make sure you discuss any questions you have with your health care provider. Document Released: 08/25/2009  Document Revised: 06/26/2016 Document Reviewed: 03/15/2015 Elsevier Interactive Patient Education  [employer].

## 2018-05-03 LAB — CBC
HEMATOCRIT: 26.2 % — AB (ref 34.0–46.6)
Hemoglobin: 7.8 g/dL — ABNORMAL LOW (ref 11.1–15.9)
MCH: 20.9 pg — ABNORMAL LOW (ref 26.6–33.0)
MCHC: 29.8 g/dL — AB (ref 31.5–35.7)
MCV: 70 fL — ABNORMAL LOW (ref 79–97)
Platelets: 422 10*3/uL (ref 150–450)
RBC: 3.73 x10E6/uL — ABNORMAL LOW (ref 3.77–5.28)
RDW: 22.3 % — AB (ref 12.3–15.4)
WBC: 5.4 10*3/uL (ref 3.4–10.8)

## 2018-05-06 ENCOUNTER — Ambulatory Visit (HOSPITAL_BASED_OUTPATIENT_CLINIC_OR_DEPARTMENT_OTHER)
Admission: RE | Admit: 2018-05-06 | Discharge: 2018-05-06 | Disposition: A | Discharge status: Home / Self Care | Payer: 59 | Source: Ambulatory Visit | Attending: Obstetrics & Gynecology | Admitting: Obstetrics & Gynecology

## 2018-05-06 DIAGNOSIS — D259 Leiomyoma of uterus, unspecified: Secondary | ICD-10-CM

## 2018-05-06 DIAGNOSIS — N83201 Unspecified ovarian cyst, right side: Secondary | ICD-10-CM | POA: Insufficient documentation

## 2018-05-06 LAB — CERVICOVAGINAL ANCILLARY ONLY
BACTERIAL VAGINITIS: POSITIVE — AB
CANDIDA VAGINITIS: NEGATIVE
Chlamydia: NEGATIVE
NEISSERIA GONORRHEA: NEGATIVE
TRICH (WINDOWPATH): NEGATIVE

## 2018-05-07 ENCOUNTER — Other Ambulatory Visit: Payer: Self-pay | Admitting: Family Medicine

## 2018-05-19 ENCOUNTER — Other Ambulatory Visit: Payer: Self-pay

## 2018-05-19 DIAGNOSIS — B9689 Other specified bacterial agents as the cause of diseases classified elsewhere: Secondary | ICD-10-CM

## 2018-05-19 DIAGNOSIS — N760 Acute vaginitis: Principal | ICD-10-CM

## 2018-05-19 DIAGNOSIS — D259 Leiomyoma of uterus, unspecified: Secondary | ICD-10-CM

## 2018-05-19 MED ORDER — METRONIDAZOLE 500 MG PO TABS
500.0000 mg | ORAL_TABLET | Freq: Two times a day (BID) | ORAL | 0 refills | Status: DC
Start: 2018-05-19 — End: 2018-06-06

## 2018-05-19 NOTE — Progress Notes (Signed)
Pt called the office requesting [place] results from 6/25. Explained to pt that compared to her last [place], the fibroids are similar in size but there is a cyst on her right side. Pt also informed that she tested positive for BV and medication was sent to her pharmacy.

## 2018-05-19 NOTE — Progress Notes (Signed)
Error

## 2018-05-22 DIAGNOSIS — D508 Other iron deficiency anemias: Secondary | ICD-10-CM | POA: Diagnosis not present

## 2018-05-22 DIAGNOSIS — C73 Malignant neoplasm of thyroid gland: Secondary | ICD-10-CM | POA: Diagnosis not present

## 2018-05-26 ENCOUNTER — Ambulatory Visit: Payer: 59 | Admitting: Obstetrics & Gynecology

## 2018-06-05 ENCOUNTER — Encounter: Payer: Self-pay | Admitting: Obstetrics & Gynecology

## 2018-06-05 ENCOUNTER — Ambulatory Visit (INDEPENDENT_AMBULATORY_CARE_PROVIDER_SITE_OTHER): Payer: 59 | Admitting: Obstetrics & Gynecology

## 2018-06-05 ENCOUNTER — Encounter (HOSPITAL_COMMUNITY): Payer: Self-pay

## 2018-06-05 VITALS — BP 167/92 | HR 105 | Ht 67.0 in | Wt 190.0 lb

## 2018-06-05 DIAGNOSIS — R102 Pelvic and perineal pain: Secondary | ICD-10-CM | POA: Diagnosis not present

## 2018-06-05 DIAGNOSIS — Z720 Tobacco use: Secondary | ICD-10-CM | POA: Diagnosis not present

## 2018-06-05 DIAGNOSIS — D219 Benign neoplasm of connective and other soft tissue, unspecified: Secondary | ICD-10-CM | POA: Diagnosis not present

## 2018-06-05 DIAGNOSIS — D500 Iron deficiency anemia secondary to blood loss (chronic): Secondary | ICD-10-CM

## 2018-06-05 DIAGNOSIS — I10 Essential (primary) hypertension: Secondary | ICD-10-CM

## 2018-06-05 DIAGNOSIS — N939 Abnormal uterine and vaginal bleeding, unspecified: Secondary | ICD-10-CM | POA: Diagnosis not present

## 2018-06-05 MED ORDER — ENALAPRIL MALEATE 10 MG PO TABS: 10.0000 mg | ORAL_TABLET | Freq: Every day | ORAL | 0 refills | Status: DC

## 2018-06-05 MED ORDER — INTEGRA F 125-1 MG PO CAPS: 1.0000 | ORAL_CAPSULE | Freq: Every day | ORAL | 1 refills | Status: DC

## 2018-06-05 MED FILL — FOLIVANE-F CAPSULE: 125-1 | 30 days supply | Qty: 30 | Fill #0

## 2018-06-05 MED FILL — ENALAPRIL MALEATE 10 MG TAB: 10 | 31 days supply | Qty: 31 | Fill #0

## 2018-06-05 NOTE — Progress Notes (Signed)
History:  47 y.o. G3P3 here today for f/u of AUB- L and results of [place] from 05/06/2018.  Pt did not take an y of the meds prescribed. She wants  Hyst. 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 Does have to climb into the forklift. Pts smokes 1ppd of tob. Pt also smokes THC $50  The following portions of the patient's history were reviewed and updated as appropriate: allergies, current medications, past family history, past medical history, past social history, past surgical history and problem list.  Review of Systems:  Pt with h/o HTN  on no meds    Objective:  Physical Exam Blood pressure (!) 167/92, pulse (!) 105, height 5\' 7"  (1.702 m), weight 190 lb (86.2 kg), last menstrual period 04/19/2018.  CONSTITUTIONAL: Well-developed, well-nourished female in no acute distress.  HENT:  Normocephalic, atraumatic EYES: Conjunctivae and EOM are normal. No scleral icterus.  NECK: Normal range of motion SKIN: Skin is warm and dry. No rash noted. Not diaphoretic.No pallor. [place]: Alert and oriented to person, place, and time. Normal coordination.   Abd: Soft, nontender and nondistended Pelvic: Normal appearing external genitalia; normal appearing vaginal mucosa and cervix.  Normal discharge.  Small uterus, no other palpable masses, no uterine or adnexal tenderness  Labs and Imaging US Transvaginal Non-ob  Result Date: 05/06/2018 CLINICAL DATA:  Patient with history of uterine fibroids. EXAM: TRANSABDOMINAL AND TRANSVAGINAL ULTRASOUND OF PELVIS TECHNIQUE: Both transabdominal and transvaginal ultrasound examinations of the pelvis were performed. Transabdominal technique was performed for global imaging of the pelvis including uterus, ovaries, adnexal regions, and pelvic cul-de-sac. It was necessary to proceed with endovaginal exam following the transabdominal exam to visualize the endometrium. COMPARISON:  Pelvic ultrasound 10/17/2016 FINDINGS: Uterus Measurements: 12.0 x 6.1 x 7.0 cm.  Multiple uterine fibroids are redemonstrated. Unchanged intramural fibroid anterior uterine fundus measuring 1.5 x 1.5 x 1.4 cm. Unchanged 3.8 x 3.5 x 3.2 cm fibroid within the anterior uterine body. Unchanged 2.0 x 1.9 x 1.5 cm fibroid within the posterior uterine body. Endometrium Thickness: 10 mm.  No focal abnormality visualized. Right ovary Measurements: 7.3 x 3.3 x 2.6 cm. Within the right adnexa there is a 4.1 x 2.8 x 3.4 cm cystic structure with internal septations. Left ovary Measurements: 3.2 x 1.8 x 2.7 cm. Normal appearance/no adnexal mass. Other findings Trace fluid in the pelvis. IMPRESSION: 1. Multiple uterine fibroids as above, overall similar when compared to prior exam. 2. Within the right adnexa there is a 4.1 cm complicated cystic structure with internal septations, potentially representing an ovarian cyst. Recommend follow-up pelvic ultrasound in 3 months to assess for interval change/resolution. If this lesion persists, surgical consultation may be warranted, depending upon sonographic characteristics. Electronically Signed   By: Lovey Newcomer M.D.   On: 05/06/2018 12:32   US Pelvis Complete  Result Date: 05/06/2018 CLINICAL DATA:  Patient with history of uterine fibroids. EXAM: TRANSABDOMINAL AND TRANSVAGINAL ULTRASOUND OF PELVIS TECHNIQUE: Both transabdominal and transvaginal ultrasound examinations of the pelvis were performed. Transabdominal technique was performed for global imaging of the pelvis including uterus, ovaries, adnexal regions, and pelvic cul-de-sac. It was necessary to proceed with endovaginal exam following the transabdominal exam to visualize the endometrium. COMPARISON:  Pelvic ultrasound 10/17/2016 FINDINGS: Uterus Measurements: 12.0 x 6.1 x 7.0 cm. Multiple uterine fibroids are redemonstrated. Unchanged intramural fibroid anterior uterine fundus measuring 1.5 x 1.5 x 1.4 cm. Unchanged 3.8 x 3.5 x 3.2 cm fibroid within the anterior uterine body. Unchanged 2.0 x 1.9  x 1.5  cm fibroid within the posterior uterine body. Endometrium Thickness: 10 mm.  No focal abnormality visualized. Right ovary Measurements: 7.3 x 3.3 x 2.6 cm. Within the right adnexa there is a 4.1 x 2.8 x 3.4 cm cystic structure with internal septations. Left ovary Measurements: 3.2 x 1.8 x 2.7 cm. Normal appearance/no adnexal mass. Other findings Trace fluid in the pelvis. IMPRESSION: 1. Multiple uterine fibroids as above, overall similar when compared to prior exam. 2. Within the right adnexa there is a 4.1 cm complicated cystic structure with internal septations, potentially representing an ovarian cyst. Recommend follow-up pelvic ultrasound in 3 months to assess for interval change/resolution. If this lesion persists, surgical consultation may be warranted, depending upon sonographic characteristics. Electronically Signed   By: Lovey Newcomer M.D.   On: 05/06/2018 12:32    Assessment & Plan:   1. Essential hypertension - enalapril (VASOTEC) 10 MG tablet; Take 1 tablet (10 mg total) by mouth daily.  Dispense: 90 tablet; Refill: 0  2. Fibroids Patient desires surgical management with [employer] with RSO and left salpingectomy.  The risks of surgery were discussed in detail with the patient including but not limited to: bleeding which may require transfusion or reoperation; infection which may require prolonged hospitalization or re-hospitalization and antibiotic therapy; injury to bowel, bladder, ureters and major vessels or other surrounding organs; need for additional procedures including laparotomy; thromboembolic phenomenon, incisional problems and other postoperative or anesthesia complications.  Patient was told that the likelihood that her condition and symptoms will be treated effectively with this surgical management was very high; the postoperative expectations were also discussed in detail. The patient also understands the alternative treatment options which were discussed in full. All questions were  answered.  She was told that she will be contacted by our surgical scheduler regarding the time and date of her surgery; routine preoperative instructions of having nothing to eat or drink after midnight on the day prior to surgery and also coming to the hospital 1 1/2 hours prior to her time of surgery were also emphasized.  She was told she may be called for a preoperative appointment about a week prior to surgery and will be given further preoperative instructions at that visit. Printed patient education handouts about the procedure were given to the patient to review at home.  3. Abnormal uterine bleeding (AUB) See above  - TSH - CBC  4. Pelvic pain in female See above  5. Tobacco abuse Recommended tob cessation   6. Anemia due to blood loss, chronic  - Fe Fum-FePoly-FA-Vit C-Vit B3 (INTEGRA F) 125-1 MG CAPS; Take 1 capsule by mouth daily.  Dispense: 60 capsule; Refill: 1 AUB and  Pelvic pain due to uterine fibroids.    Total face-to-face time with patient was 30 min.  Greater than 50% was spent in counseling and coordination of care with the patient.   Rease Wence L. Harraway-Smith, M.D., Cherlynn June

## 2018-06-05 NOTE — Patient Instructions (Addendum)
Cooking With Less Salt Cooking with less salt is one way to reduce the amount of sodium you get from food. Depending on your condition and overall health, your health care provider or diet and nutrition specialist (dietitian) may recommend that you reduce your sodium intake. Most people should have less than 2,300 milligrams (mg) of sodium each day. If you have high blood pressure (hypertension), you may need to limit your sodium to 1,500 mg each day. Follow the tips below to help reduce your sodium intake. What do I need to know about cooking with less salt? Shopping  Buy sodium-free or low-sodium products. Look for the following words on food labels: ? Low-sodium. ? Sodium-free. ? Reduced-sodium. ? No salt added. ? Unsalted.  Buy fresh or frozen vegetables. Avoid canned vegetables.  Avoid buying meats or protein foods that have been injected with broth or saline solution.  Avoid cured or smoked meats, such as hot dogs, bacon, salami, ham, and bologna. Reading food labels  Check the food label before buying or using packaged ingredients.  Look for products with no more than 140 mg of sodium in one serving.  Do not choose foods with salt as one of the first three ingredients on the ingredients list. If salt is one of the first three ingredients, it usually means the item is high in sodium, because ingredients are listed in order of amount in the food item. Cooking  Use herbs, seasonings without salt, and spices as substitutes for salt in foods.  Use sodium-free baking soda when baking.  Grill, braise, or roast foods to add flavor with less salt.  Avoid adding salt to pasta, rice, or hot cereals while cooking.  Drain and rinse canned vegetables before use.  Avoid adding salt when cooking sweets and desserts.  Cook with low-sodium ingredients. What are some salt alternatives? The following are herbs, seasonings, and spices that can be used instead of salt to give taste to your  food. Herbs should be fresh or dried. Do not choose packaged mixes. Next to the name of the herb, spice, or seasoning are some examples of foods you can pair it with. Herbs  Bay leaves - Soups, meat and vegetable dishes, and spaghetti sauce.  Basil - [employer], soups, pasta, and fish dishes.  Cilantro - Meat, poultry, and vegetable dishes.  Chili powder - Marinades and Mexican dishes.  Chives - Salad dressings and potato dishes.  Cumin - Mexican dishes, couscous, and meat dishes.  Dill - Fish dishes, sauces, and salads.  Fennel - Meat and vegetable dishes, breads, and cookies.  Garlic (do not use garlic salt) - [place] dishes, meat dishes, salad dressings, and sauces.  Marjoram - Soups, potato dishes, and meat dishes.  Oregano - Pizza and spaghetti sauce.  Parsley - Salads, soups, pasta, and meat dishes.  Rosemary - [place] dishes, salad dressings, soups, and red meats.  Saffron - Fish dishes, pasta, and some poultry dishes.  Sage - Stuffings and sauces.  Tarragon - Fish and [employer].  Thyme - Stuffing, meat, and fish dishes. Seasonings  Lemon juice - Fish dishes, poultry dishes, vegetables, and salads.  Vinegar - Salad dressings, vegetables, and fish dishes. Spices  Cinnamon - Sweet dishes, such as cakes, cookies, and puddings.  Cloves - Gingerbread, puddings, and marinades for meats.  Curry - Vegetable dishes, fish and poultry dishes, and stir-fry dishes.  Ginger - Vegetables dishes, fish dishes, and stir-fry dishes.  Nutmeg - Pasta, vegetables, poultry, fish dishes, and custard. What  are some low-sodium ingredients and foods?  Fresh or frozen fruits and vegetables with no sauce added.  Fresh or frozen whole meats, poultry, and fish with no sauce added.  Eggs.  Noodles, pasta, quinoa, rice.  Shredded or puffed wheat or puffed rice.  Regular or quick oats.  Milk, yogurt, hard cheeses, and low-sodium cheeses. Good cheese choices include  [place]iss, [place]. Always check the label for the serving size and sodium content.  Unsalted butter or margarine.  Unsalted nuts.  Sherbet or ice cream (keep to  cup per serving).  Homemade pudding.  Sodium-free baking soda and baking powder. This is not a complete list of low-sodium ingredients and foods. Contact your dietitian for more options. Summary  Cooking with less salt is one way to reduce the amount of sodium that you get from food.  Buy sodium-free or low-sodium products.  Check the food label before using or buying packaged ingredients.  Use herbs, seasonings without salt, and spices as substitutes for salt in foods. This information is not intended to replace advice given to you by your health care provider. Make sure you discuss any questions you have with your health care provider. Document Released: 10/29/2005 Document Revised: 11/06/2016 Document Reviewed: 11/06/2016 Elsevier Interactive Patient Education  2017 [place] Laparoscopic Hysterectomy A total laparoscopic hysterectomy is a minimally invasive surgery to remove your uterus and cervix. This surgery is performed by making several small cuts (incisions) in your abdomen. It can also be done with a thin, lighted tube (laparoscope) inserted into two small incisions in your lower abdomen. Your fallopian tubes and ovaries can be removed (bilateral salpingo-oophorectomy) during this surgery as well.Benefits of minimally invasive surgery include:  Less pain.  Less risk of blood loss.  Less risk of infection.  Quicker return to normal activities.  Tell a health care provider about:  Any allergies you have.  All medicines you are taking, including vitamins, herbs, eye drops, creams, and over-the-counter medicines.  Any problems you or family members have had with anesthetic medicines.  Any blood disorders you have.  Any surgeries you have had.  Any medical conditions you  have. What are the risks? Generally, this is a safe procedure. However, as with any procedure, complications can occur. Possible complications include:  Bleeding.  Blood clots in the legs or lung.  Infection.  Injury to surrounding organs.  Problems with anesthesia.  Early menopause symptoms (hot flashes, night sweats, insomnia).  Risk of conversion to an open abdominal incision.  What happens before the procedure?  Ask your health care provider about changing or stopping your regular medicines.  Do not take aspirin or blood thinners (anticoagulants) for 1 week before the surgery or as told by your health care provider.  Do not eat or drink anything for 8 hours before the surgery or as told by your health care provider.  Quit smoking if you smoke.  Arrange for a ride home after surgery and for someone to help you at home during recovery. What happens during the procedure?  You will be given antibiotic medicine.  An IV tube will be placed in your arm. You will be given medicine to make you sleep (general anesthetic).  A gas (carbon dioxide) will be used to inflate your abdomen. This will allow your surgeon to look inside your abdomen, perform your surgery, and treat any other problems found if necessary.  Three or four small incisions (often less than 1/2 inch) will be made in your  abdomen. One of these incisions will be made in the area of your belly button (navel). The laparoscope will be inserted into the incision. Your surgeon will look through the laparoscope while doing your procedure.  Other surgical instruments will be inserted through the other incisions.  Your uterus may be removed through your vagina or cut into small pieces and removed through the small incisions.  Your incisions will be closed. What happens after the procedure?  The gas will be released from inside your abdomen.  You will be taken to the recovery area where a nurse will watch and check your  progress. Once you are awake, stable, and taking fluids well, without other problems, you will return to your room or be allowed to go home.  There is usually minimal discomfort following the surgery because the incisions are so small.  You will be given pain medicine while you are in the hospital and for when you go home. This information is not intended to replace advice given to you by your health care provider. Make sure you discuss any questions you have with your health care provider. Document Released: 08/26/2007 Document Revised: 04/05/2016 Document Reviewed: 05/19/2013 Elsevier Interactive Patient Education  2017 [place].  Tobacco Use Disorder Tobacco use disorder (TUD) is a mental disorder. It is the long-term use of tobacco in spite of related health problems or difficulty with normal life activities. Tobacco is most commonly smoked as cigarettes and less commonly as cigars or pipes. Smokeless chewing tobacco and snuff are also popular. People with TUD get a feeling of extreme pleasure (euphoria) from using tobacco and have a desire to use it again and again. Repeated use of tobacco can cause problems. The addictive effects of tobacco are due mainly tothe ingredient nicotine. Nicotine also causes a rush of adrenaline (epinephrine) in the body. This leads to increased blood pressure, heart rate, and breathing rate. These changes may cause problems for people with high blood pressure, weak hearts, or lung disease. High doses of nicotine in children and pets can lead to seizures and death. Tobacco contains a number of other unsafe chemicals. These chemicals are especially harmful when inhaled as smoke and can damage almost every organ in the body. Smokers live shorter lives than nonsmokers and are at risk of dying from a number of diseases and cancers. Tobacco smoke can also cause health problems for nonsmokers (due to inhaling secondhand smoke). Smoking is also a fire hazard. TUD usually  starts in the late teenage years and is most common in young adults between the ages of 29 and 51 years. People who start smoking earlier in life are more likely to continue smoking as adults. TUD is somewhat more common in men than women. People with TUD are at higher risk for using alcohol and other drugs of abuse. What increases the risk? Risk factors for TUD include:  Having family members with the disorder.  Being around people who use tobacco.  Having an existing mental health issue such as schizophrenia, depression, bipolar disorder, ADHD, or posttraumatic stress disorder (PTSD).  What are the signs or symptoms? People with tobacco use disorder have two or more of the following signs and symptoms within 12 months:  Use of more tobacco over a longer period than intended.  Not able to cut down or control tobacco use.  A lot of time spent obtaining or using tobacco.  Strong desire or urge to use tobacco (craving). Cravings may last for 6 months or longer after  quitting.  Use of tobacco even when use leads to major problems at work, school, or home.  Use of tobacco even when use leads to relationship problems.  Giving up or cutting down on important life activities because of tobacco use.  Repeatedly using tobacco in situations where it puts you or others in physical danger, like smoking in bed.  Use of tobacco even when it is known that a physical or mental problem is likely related to tobacco use. ? Physical problems are numerous and may include chronic bronchitis, emphysema, lung and other cancers, gum disease, high blood pressure, heart disease, and stroke. ? Mental problems caused by tobacco may include difficulty sleeping and anxiety.  Need to use greater amounts of tobacco to get the same effect. This means you have developed a tolerance.  Withdrawal symptoms as a result of stopping or rapidly cutting back use. These symptoms may last a month or more after quitting and  include the following: ? Depressed, anxious, or irritable mood. ? Difficulty concentrating. ? Increased appetite. ? Restlessness or trouble sleeping. ? Use of tobacco to avoid withdrawal symptoms.  How is this diagnosed? Tobacco use disorder is diagnosed by your health care provider. A diagnosis may be made by:  Your health care provider asking questions about your tobacco use and any problems it may be causing.  A physical exam.  Lab tests.  You may be referred to a mental health professional or addiction specialist.  The severity of tobacco use disorder depends on the number of signs and symptoms you have:  Mild-Two or three symptoms.  Moderate-Four or five symptoms.  Severe-Six or more symptoms.  How is this treated? Many people with tobacco use disorder are unable to quit on their own and need help. Treatment options include the following:  Nicotine replacement therapy (NRT). NRT provides nicotine without the other harmful chemicals in tobacco. NRT gradually lowers the dosage of nicotine in the body and reduces withdrawal symptoms. NRT is available in over-the-counter forms (gum, lozenges, and skin patches) as well as prescription forms (mouth inhaler and nasal spray).  Medicines.This may include: ? Antidepressant medicine that may reduce nicotine cravings. ? A medicine that acts on nicotine receptors in the brain to reduce cravings and withdrawal symptoms. It may also block the effects of tobacco in people with TUD who relapse.  Counseling or talk therapy. A form of talk therapy called behavioral therapy is commonly used to treat people with TUD. Behavioral therapy looks at triggers for tobacco use, how to avoid them, and how to cope with cravings. It is most effective in person or by phone but is also available in self-help forms (books and Internet websites).  Support groups. These provide emotional support, advice, and guidance for quitting tobacco.  The most effective  treatment for TUD is usually a combination of medicine, talk therapy, and support groups. Follow these instructions at home:  Keep all follow-up visits as directed by your health care provider. This is important.  Take medicines only as directed by your health care provider.  Check with your health care provider before starting new prescription or over-the-counter medicines. Contact a health care provider if:  You are not able to take your medicines as prescribed.  Treatment is not helping your TUD and your symptoms get worse. Get help right away if:  You have serious thoughts about hurting yourself or others.  You have trouble breathing, chest pain, sudden weakness, or sudden numbness in part of your body. This information  is not intended to replace advice given to you by your health care provider. Make sure you discuss any questions you have with your health care provider. Document Released: 07/04/2004 Document Revised: 07/01/2016 Document Reviewed: 12/25/2013 Elsevier Interactive Patient Education  [employer].

## 2018-06-06 LAB — CBC
Hematocrit: 29.2 % — ABNORMAL LOW (ref 34.0–46.6)
Hemoglobin: 8.2 g/dL — ABNORMAL LOW (ref 11.1–15.9)
MCH: 20.6 pg — ABNORMAL LOW (ref 26.6–33.0)
MCHC: 28.1 g/dL — ABNORMAL LOW (ref 31.5–35.7)
MCV: 73 fL — ABNORMAL LOW (ref 79–97)
PLATELETS: 426 10*3/uL (ref 150–450)
RBC: 3.99 x10E6/uL (ref 3.77–5.28)
RDW: 23.1 % — AB (ref 12.3–15.4)
WBC: 5.5 10*3/uL (ref 3.4–10.8)

## 2018-06-06 LAB — TSH: TSH: 6.55 u[IU]/mL — ABNORMAL HIGH (ref 0.450–4.500)

## 2018-06-10 NOTE — Patient Instructions (Addendum)
JUNELLA DOMKE  06/10/2018   Your procedure is scheduled on: 06-17-18  Report to [facility] Main  Entrance              Report to admitting at 1000 AM    Call this number if you have problems the morning of surgery [phone]    Remember: Do not eat food or drink liquids :After Midnight.  FOLLOW ANY ORDERS FROM YOUR SURGEONS OFFICE   Take these medicines the morning of surgery with A SIP OF WATER: LEVOTHYROXINE (SYNTHROID)              You may not have any metal on your body including hair pins and              piercings  Do not wear jewelry, make-up, lotions, powders or perfumes, deodorant             Do not wear nail polish.  Do not shave  48 hours prior to surgery.     Do not bring valuables to the hospital. [place].  Contacts, dentures or bridgework may not be worn into surgery.  Leave suitcase in the car. After surgery it may be brought to your room.                  Please read over the following fact sheets you were given: _____________________________________________________________________          [facility] - Preparing for Surgery Before surgery, you can play an important role.  Because skin is not sterile, your skin needs to be as free of germs as possible.  You can reduce the number of germs on your skin by washing with CHG (chlorahexidine gluconate) soap before surgery.  CHG is an antiseptic cleaner which kills germs and bonds with the skin to continue killing germs even after washing. Please DO NOT use if you have an allergy to CHG or antibacterial soaps.  If your skin becomes reddened/irritated stop using the CHG and inform your nurse when you arrive at Short Stay. Do not shave (including legs and underarms) for at least 48 hours prior to the first CHG shower.  You may shave your face/neck. Please follow these instructions carefully:  1.  Shower with CHG Soap the night before surgery and the   morning of Surgery.  2.  If you choose to wash your hair, wash your hair first as usual with your  normal  shampoo.  3.  After you shampoo, rinse your hair and body thoroughly to remove the  shampoo.                           4.  Use CHG as you would any other liquid soap.  You can apply chg directly  to the skin and wash                       Gently with a scrungie or clean washcloth.  5.  Apply the CHG Soap to your body ONLY FROM THE NECK DOWN.   Do not use on face/ open                           Wound  or open sores. Avoid contact with eyes, ears mouth and genitals (private parts).                       Wash face,  Genitals (private parts) with your normal soap.             6.  Wash thoroughly, paying special attention to the area where your surgery  will be performed.  7.  Thoroughly rinse your body with warm water from the neck down.  8.  DO NOT shower/wash with your normal soap after using and rinsing off  the CHG Soap.                9.  Pat yourself dry with a clean towel.            10.  Wear clean pajamas.            11.  Place clean sheets on your bed the night of your first shower and do not  sleep with pets. Day of Surgery : Do not apply any lotions/deodorants the morning of surgery.  Please wear clean clothes to the hospital/surgery center.  FAILURE TO FOLLOW THESE INSTRUCTIONS MAY RESULT IN THE CANCELLATION OF YOUR SURGERY PATIENT SIGNATURE_________________________________  NURSE SIGNATURE__________________________________  ________________________________________________________________________

## 2018-06-11 ENCOUNTER — Encounter (HOSPITAL_COMMUNITY): Payer: Self-pay

## 2018-06-11 ENCOUNTER — Other Ambulatory Visit: Payer: Self-pay

## 2018-06-11 ENCOUNTER — Encounter (HOSPITAL_COMMUNITY)
Admission: RE | Admit: 2018-06-11 | Discharge: 2018-06-11 | Disposition: A | Discharge status: Home / Self Care | Payer: 59 | Source: Ambulatory Visit | Attending: Obstetrics & Gynecology | Admitting: Obstetrics & Gynecology

## 2018-06-11 DIAGNOSIS — Z01818 Encounter for other preprocedural examination: Secondary | ICD-10-CM | POA: Diagnosis not present

## 2018-06-11 DIAGNOSIS — Z01812 Encounter for preprocedural laboratory examination: Secondary | ICD-10-CM | POA: Diagnosis not present

## 2018-06-11 HISTORY — DX: Headache: R51

## 2018-06-11 HISTORY — DX: Headache, unspecified: R51.9

## 2018-06-11 HISTORY — DX: Essential (primary) hypertension: I10

## 2018-06-11 LAB — BASIC METABOLIC PANEL
Anion gap: 5 (ref 5–15)
BUN: 14 mg/dL (ref 6–20)
CO2: 27 mmol/L (ref 22–32)
Calcium: 8.8 mg/dL — ABNORMAL LOW (ref 8.9–10.3)
Chloride: 109 mmol/L (ref 98–111)
Creatinine, Ser: 0.77 mg/dL (ref 0.44–1.00)
GFR calc non Af Amer: >60 mL/min (ref >60)
GLUCOSE: 86 mg/dL (ref 70–99)
POTASSIUM: 4.5 mmol/L (ref 3.5–5.1)
Sodium: 141 mmol/L (ref 135–145)

## 2018-06-11 LAB — CBC
HCT: 29.8 % — ABNORMAL LOW (ref 36.0–46.0)
Hemoglobin: 9.1 g/dL — ABNORMAL LOW (ref 12.0–15.0)
MCH: 23.2 pg — ABNORMAL LOW (ref 26.0–34.0)
MCHC: 30.5 g/dL (ref 30.0–36.0)
MCV: 75.8 fL — AB (ref 78.0–100.0)
Platelets: 343 10*3/uL (ref 150–400)
RBC: 3.93 MIL/uL (ref 3.87–5.11)
RDW: 25.1 % — ABNORMAL HIGH (ref 11.5–15.5)
WBC: 5.8 10*3/uL (ref 4.0–10.5)

## 2018-06-11 LAB — HCG, SERUM, QUALITATIVE: Preg, Serum: NEGATIVE

## 2018-06-11 NOTE — Progress Notes (Signed)
Cbc done 06-11-18 routed to Dr. Maylene Roes Tamala Julian via epic

## 2018-06-13 NOTE — Progress Notes (Signed)
Final EKG in Epic

## 2018-06-15 ENCOUNTER — Telehealth: Payer: Self-pay | Admitting: Obstetrics & Gynecology

## 2018-06-15 NOTE — Telephone Encounter (Signed)
TC to pt to let her know that there are insurance issues with moving forward on her case. I have a peer-to-peer scheduled for tomorrow am and will call her after that. Pt did not answer. Left message.   Of note, the initial peer-to-peer was scheduled for Friday but, the physician at her insurance company never called. When I called them they told me that it needed to be rescheduled as the provider was not available.    Dontray Haberland L. Harraway-Smith, M.D., Cherlynn June

## 2018-06-16 ENCOUNTER — Other Ambulatory Visit: Payer: Self-pay | Admitting: Obstetrics & Gynecology

## 2018-06-16 ENCOUNTER — Telehealth: Payer: Self-pay

## 2018-06-16 ENCOUNTER — Ambulatory Visit: Payer: 59

## 2018-06-16 DIAGNOSIS — N939 Abnormal uterine and vaginal bleeding, unspecified: Secondary | ICD-10-CM

## 2018-06-16 DIAGNOSIS — R7989 Other specified abnormal findings of blood chemistry: Secondary | ICD-10-CM | POA: Insufficient documentation

## 2018-06-16 DIAGNOSIS — D500 Iron deficiency anemia secondary to blood loss (chronic): Secondary | ICD-10-CM | POA: Insufficient documentation

## 2018-06-16 NOTE — Telephone Encounter (Signed)
Error

## 2018-06-16 NOTE — Progress Notes (Signed)
Sent to lab for blood draw. Kathrene Alu RN

## 2018-06-16 NOTE — Telephone Encounter (Signed)
Called [facility] to have labs added was told that specimen was held for only seven days and is no longer available. Called pt schedule an appt for her to come in to have labs drawn. Pt is scheduled to come in at 2:30pm on 06/16/18.

## 2018-06-16 NOTE — Telephone Encounter (Signed)
-----   Message from Lavonia Drafts, MD sent at 06/16/2018  7:50 AM EDT ----- Can we please call this pt and see if the labs can add T3 and free T4 to her TSH. If not, she will need to come in ASAP to have this drawn.    Thx, clh-S

## 2018-06-17 ENCOUNTER — Ambulatory Visit (HOSPITAL_COMMUNITY): Payer: 59 | Admitting: Anesthesiology

## 2018-06-17 ENCOUNTER — Observation Stay (HOSPITAL_COMMUNITY)
Admission: RE | Admit: 2018-06-17 | Discharge: 2018-06-18 | Disposition: A | Discharge status: Home / Self Care | Payer: 59 | Source: Ambulatory Visit | Attending: Obstetrics & Gynecology | Admitting: Obstetrics & Gynecology

## 2018-06-17 ENCOUNTER — Encounter (HOSPITAL_COMMUNITY): Payer: Self-pay | Admitting: *Deleted

## 2018-06-17 ENCOUNTER — Encounter (HOSPITAL_COMMUNITY)
Admission: RE | Disposition: A | Discharge status: Home / Self Care | Payer: Self-pay | Source: Ambulatory Visit | Attending: Obstetrics & Gynecology

## 2018-06-17 ENCOUNTER — Other Ambulatory Visit: Payer: Self-pay

## 2018-06-17 DIAGNOSIS — N83201 Unspecified ovarian cyst, right side: Secondary | ICD-10-CM | POA: Insufficient documentation

## 2018-06-17 DIAGNOSIS — Z8521 Personal history of malignant neoplasm of larynx: Secondary | ICD-10-CM | POA: Diagnosis not present

## 2018-06-17 DIAGNOSIS — E890 Postprocedural hypothyroidism: Secondary | ICD-10-CM | POA: Insufficient documentation

## 2018-06-17 DIAGNOSIS — R102 Pelvic and perineal pain: Secondary | ICD-10-CM | POA: Diagnosis not present

## 2018-06-17 DIAGNOSIS — D259 Leiomyoma of uterus, unspecified: Principal | ICD-10-CM | POA: Diagnosis present

## 2018-06-17 DIAGNOSIS — N938 Other specified abnormal uterine and vaginal bleeding: Secondary | ICD-10-CM | POA: Diagnosis not present

## 2018-06-17 DIAGNOSIS — I10 Essential (primary) hypertension: Secondary | ICD-10-CM | POA: Diagnosis not present

## 2018-06-17 DIAGNOSIS — N838 Other noninflammatory disorders of ovary, fallopian tube and broad ligament: Secondary | ICD-10-CM | POA: Diagnosis not present

## 2018-06-17 DIAGNOSIS — Z79890 Hormone replacement therapy (postmenopausal): Secondary | ICD-10-CM | POA: Insufficient documentation

## 2018-06-17 DIAGNOSIS — K659 Peritonitis, unspecified: Secondary | ICD-10-CM | POA: Diagnosis not present

## 2018-06-17 DIAGNOSIS — Z79899 Other long term (current) drug therapy: Secondary | ICD-10-CM | POA: Insufficient documentation

## 2018-06-17 DIAGNOSIS — K658 Other peritonitis: Secondary | ICD-10-CM | POA: Diagnosis not present

## 2018-06-17 DIAGNOSIS — N888 Other specified noninflammatory disorders of cervix uteri: Secondary | ICD-10-CM | POA: Insufficient documentation

## 2018-06-17 DIAGNOSIS — N939 Abnormal uterine and vaginal bleeding, unspecified: Secondary | ICD-10-CM | POA: Diagnosis present

## 2018-06-17 DIAGNOSIS — F17200 Nicotine dependence, unspecified, uncomplicated: Secondary | ICD-10-CM | POA: Diagnosis present

## 2018-06-17 DIAGNOSIS — N83291 Other ovarian cyst, right side: Secondary | ICD-10-CM | POA: Insufficient documentation

## 2018-06-17 DIAGNOSIS — Z9889 Other specified postprocedural states: Secondary | ICD-10-CM

## 2018-06-17 DIAGNOSIS — F17210 Nicotine dependence, cigarettes, uncomplicated: Secondary | ICD-10-CM | POA: Insufficient documentation

## 2018-06-17 DIAGNOSIS — N921 Excessive and frequent menstruation with irregular cycle: Secondary | ICD-10-CM | POA: Diagnosis present

## 2018-06-17 DIAGNOSIS — D500 Iron deficiency anemia secondary to blood loss (chronic): Secondary | ICD-10-CM | POA: Diagnosis present

## 2018-06-17 DIAGNOSIS — N946 Dysmenorrhea, unspecified: Secondary | ICD-10-CM | POA: Diagnosis present

## 2018-06-17 HISTORY — PX: ROBOTIC ASSISTED TOTAL HYSTERECTOMY WITH BILATERAL SALPINGO OOPHERECTOMY: SHX6086

## 2018-06-17 HISTORY — PX: CYSTOSCOPY: SHX5120

## 2018-06-17 LAB — T3, FREE: T3, Free: 2.2 pg/mL (ref 2.0–4.4)

## 2018-06-17 LAB — CBC
HCT: 33.9 % — ABNORMAL LOW (ref 36.0–46.0)
Hemoglobin: 10.5 g/dL — ABNORMAL LOW (ref 12.0–15.0)
MCH: 24.4 pg — ABNORMAL LOW (ref 26.0–34.0)
MCHC: 31 g/dL (ref 30.0–36.0)
MCV: 78.8 fL (ref 78.0–100.0)
Platelets: 371 10*3/uL (ref 150–400)
RBC: 4.3 MIL/uL (ref 3.87–5.11)
RDW: 28.3 % — ABNORMAL HIGH (ref 11.5–15.5)
WBC: 5.6 10*3/uL (ref 4.0–10.5)

## 2018-06-17 LAB — PREGNANCY, URINE: Preg Test, Ur: NEGATIVE

## 2018-06-17 LAB — ABO/RH: ABO/RH(D): O POS

## 2018-06-17 LAB — T4, FREE: Free T4: 1.07 ng/dL (ref 0.82–1.77)

## 2018-06-17 LAB — PREPARE RBC (CROSSMATCH)

## 2018-06-17 SURGERY — HYSTERECTOMY, TOTAL, ROBOT-ASSISTED, LAPAROSCOPIC, WITH BILATERAL SALPINGO-OOPHORECTOMY
Anesthesia: General | Site: Bladder

## 2018-06-17 MED ORDER — MIDAZOLAM HCL 2 MG/2ML IJ SOLN
INTRAMUSCULAR | Status: AC
  Filled 2018-06-17: qty 2

## 2018-06-17 MED ORDER — OXYCODONE-ACETAMINOPHEN 5-325 MG PO TABS
ORAL_TABLET | ORAL | Status: AC
  Filled 2018-06-17: qty 1

## 2018-06-17 MED ORDER — KETOROLAC TROMETHAMINE 30 MG/ML IJ SOLN
INTRAMUSCULAR | Status: AC
  Filled 2018-06-17: qty 1

## 2018-06-17 MED ORDER — BUPIVACAINE HCL (PF) 0.5 % IJ SOLN
INTRAMUSCULAR | Status: DC | PRN
  Administered 2018-06-17: 30 mL

## 2018-06-17 MED ORDER — FENTANYL CITRATE (PF) 250 MCG/5ML IJ SOLN
INTRAMUSCULAR | Status: AC
  Filled 2018-06-17: qty 5

## 2018-06-17 MED ORDER — ZOLPIDEM TARTRATE 5 MG PO TABS: 5.0000 mg | ORAL_TABLET | Freq: Every evening | ORAL | Status: DC | PRN

## 2018-06-17 MED ORDER — BISACODYL 10 MG RE SUPP
10.0000 mg | Freq: Every day | RECTAL | Status: DC | PRN
  Filled 2018-06-17: qty 1

## 2018-06-17 MED ORDER — LIDOCAINE 2% (20 MG/ML) 5 ML SYRINGE
INTRAMUSCULAR | Status: AC
  Filled 2018-06-17: qty 5

## 2018-06-17 MED ORDER — CEFAZOLIN SODIUM-DEXTROSE 2-4 GM/100ML-% IV SOLN
2.0000 g | INTRAVENOUS | Status: AC
  Administered 2018-06-17: 2 g via INTRAVENOUS
  Filled 2018-06-17: qty 100

## 2018-06-17 MED ORDER — LIDOCAINE HCL (CARDIAC) PF 100 MG/5ML IV SOSY
PREFILLED_SYRINGE | INTRAVENOUS | Status: DC | PRN
  Administered 2018-06-17: 80 mg via INTRAVENOUS

## 2018-06-17 MED ORDER — ENOXAPARIN SODIUM 40 MG/0.4ML ~~LOC~~ SOLN
40.0000 mg | SUBCUTANEOUS | Status: DC
  Administered 2018-06-18: 40 mg via SUBCUTANEOUS
  Filled 2018-06-17: qty 0.4

## 2018-06-17 MED ORDER — STERILE WATER FOR IRRIGATION IR SOLN
Status: DC | PRN
  Administered 2018-06-17: 1000 mL

## 2018-06-17 MED ORDER — LIP MEDEX EX OINT
TOPICAL_OINTMENT | CUTANEOUS | Status: AC
  Filled 2018-06-17: qty 7

## 2018-06-17 MED ORDER — INDIGOTINDISULFONATE SODIUM 8 MG/ML IJ SOLN
INTRAMUSCULAR | Status: AC
  Filled 2018-06-17: qty 5

## 2018-06-17 MED ORDER — DEXAMETHASONE SODIUM PHOSPHATE 10 MG/ML IJ SOLN
INTRAMUSCULAR | Status: DC | PRN
  Administered 2018-06-17: 10 mg via INTRAVENOUS

## 2018-06-17 MED ORDER — ENALAPRIL MALEATE 10 MG PO TABS: 10.0000 mg | ORAL_TABLET | Freq: Every day | ORAL | Status: DC

## 2018-06-17 MED ORDER — PANTOPRAZOLE SODIUM 40 MG PO TBEC
40.0000 mg | DELAYED_RELEASE_TABLET | Freq: Every day | ORAL | Status: DC
  Administered 2018-06-17: 40 mg via ORAL
  Filled 2018-06-17: qty 1

## 2018-06-17 MED ORDER — SUGAMMADEX SODIUM 200 MG/2ML IV SOLN
INTRAVENOUS | Status: AC
  Filled 2018-06-17: qty 2

## 2018-06-17 MED ORDER — LACTATED RINGERS IV SOLN
INTRAVENOUS | Status: DC
  Administered 2018-06-17 (×2): via INTRAVENOUS

## 2018-06-17 MED ORDER — MIDAZOLAM HCL 2 MG/2ML IJ SOLN
INTRAMUSCULAR | Status: DC | PRN
  Administered 2018-06-17: 2 mg via INTRAVENOUS

## 2018-06-17 MED ORDER — LEVOTHYROXINE SODIUM 137 MCG PO TABS
137.0000 ug | ORAL_TABLET | Freq: Every day | ORAL | Status: DC
  Administered 2018-06-18: 137 ug via ORAL
  Filled 2018-06-17: qty 1

## 2018-06-17 MED ORDER — SIMETHICONE 80 MG PO CHEW
80.0000 mg | CHEWABLE_TABLET | Freq: Four times a day (QID) | ORAL | Status: DC | PRN
  Filled 2018-06-17: qty 1

## 2018-06-17 MED ORDER — PROPOFOL 10 MG/ML IV BOLUS
INTRAVENOUS | Status: AC
  Filled 2018-06-17: qty 40

## 2018-06-17 MED ORDER — INDIGOTINDISULFONATE SODIUM 8 MG/ML IJ SOLN
INTRAMUSCULAR | Status: DC | PRN
  Administered 2018-06-17: 5 mL via INTRAVENOUS

## 2018-06-17 MED ORDER — KETOROLAC TROMETHAMINE 30 MG/ML IJ SOLN
30.0000 mg | Freq: Three times a day (TID) | INTRAMUSCULAR | Status: DC
  Administered 2018-06-17 – 2018-06-18 (×2): 30 mg via INTRAVENOUS

## 2018-06-17 MED ORDER — ONDANSETRON HCL 4 MG/2ML IJ SOLN
INTRAMUSCULAR | Status: AC
  Filled 2018-06-17: qty 2

## 2018-06-17 MED ORDER — BUPIVACAINE HCL (PF) 0.5 % IJ SOLN
INTRAMUSCULAR | Status: AC
  Filled 2018-06-17: qty 30

## 2018-06-17 MED ORDER — SODIUM CHLORIDE 0.9 % IR SOLN
Status: DC | PRN
  Administered 2018-06-17: 3000 mL

## 2018-06-17 MED ORDER — TETRAHYDROZOLINE HCL 0.05 % OP SOLN
2.0000 [drp] | Freq: Every day | OPHTHALMIC | Status: DC | PRN
  Filled 2018-06-17: qty 15

## 2018-06-17 MED ORDER — HYDROMORPHONE HCL 1 MG/ML IJ SOLN
0.2500 mg | INTRAMUSCULAR | Status: DC | PRN
  Administered 2018-06-17: 0.5 mg via INTRAVENOUS

## 2018-06-17 MED ORDER — HYDROMORPHONE HCL 1 MG/ML IJ SOLN: 0.2000 mg | INTRAMUSCULAR | Status: DC | PRN

## 2018-06-17 MED ORDER — SOD CITRATE-CITRIC ACID 500-334 MG/5ML PO SOLN
30.0000 mL | ORAL | Status: DC
  Filled 2018-06-17: qty 15

## 2018-06-17 MED ORDER — SUGAMMADEX SODIUM 200 MG/2ML IV SOLN
INTRAVENOUS | Status: DC | PRN
  Administered 2018-06-17: 170 mg via INTRAVENOUS

## 2018-06-17 MED ORDER — ONDANSETRON HCL 4 MG/2ML IJ SOLN
INTRAMUSCULAR | Status: DC | PRN
  Administered 2018-06-17: 4 mg via INTRAVENOUS

## 2018-06-17 MED ORDER — FENTANYL CITRATE (PF) 250 MCG/5ML IJ SOLN
INTRAMUSCULAR | Status: AC
Start: 2018-06-17 — End: ?
  Filled 2018-06-17: qty 5

## 2018-06-17 MED ORDER — DEXAMETHASONE SODIUM PHOSPHATE 10 MG/ML IJ SOLN
INTRAMUSCULAR | Status: AC
  Filled 2018-06-17: qty 1

## 2018-06-17 MED ORDER — FENTANYL CITRATE (PF) 250 MCG/5ML IJ SOLN
INTRAMUSCULAR | Status: DC | PRN
  Administered 2018-06-17: 150 ug via INTRAVENOUS
  Administered 2018-06-17: 100 ug via INTRAVENOUS
  Administered 2018-06-17: 25 ug via INTRAVENOUS
  Administered 2018-06-17: 100 ug via INTRAVENOUS
  Administered 2018-06-17: 25 ug via INTRAVENOUS
  Administered 2018-06-17: 50 ug via INTRAVENOUS

## 2018-06-17 MED ORDER — ONDANSETRON HCL 4 MG/2ML IJ SOLN: 4.0000 mg | Freq: Four times a day (QID) | INTRAMUSCULAR | Status: DC | PRN

## 2018-06-17 MED ORDER — ARTIFICIAL TEARS OPHTHALMIC OINT
TOPICAL_OINTMENT | OPHTHALMIC | Status: AC
  Filled 2018-06-17: qty 3.5

## 2018-06-17 MED ORDER — PANTOPRAZOLE SODIUM 40 MG PO TBEC
DELAYED_RELEASE_TABLET | ORAL | Status: AC
  Filled 2018-06-17: qty 1

## 2018-06-17 MED ORDER — ONDANSETRON HCL 4 MG PO TABS
4.0000 mg | ORAL_TABLET | Freq: Four times a day (QID) | ORAL | Status: DC | PRN
  Filled 2018-06-17: qty 1

## 2018-06-17 MED ORDER — IBUPROFEN 200 MG PO TABS: 600.0000 mg | ORAL_TABLET | Freq: Four times a day (QID) | ORAL | Status: DC | PRN

## 2018-06-17 MED ORDER — KETOROLAC TROMETHAMINE 30 MG/ML IJ SOLN: 30.0000 mg | Freq: Three times a day (TID) | INTRAMUSCULAR | Status: DC

## 2018-06-17 MED ORDER — KETOROLAC TROMETHAMINE 30 MG/ML IJ SOLN
INTRAMUSCULAR | Status: DC | PRN
  Administered 2018-06-17: 30 mg via INTRAVENOUS

## 2018-06-17 MED ORDER — HYDROMORPHONE HCL 1 MG/ML IJ SOLN
INTRAMUSCULAR | Status: AC
  Filled 2018-06-17: qty 1

## 2018-06-17 MED ORDER — POLYETHYLENE GLYCOL 3350 17 G PO PACK
17.0000 g | PACK | Freq: Every day | ORAL | Status: DC | PRN
  Filled 2018-06-17: qty 1

## 2018-06-17 MED ORDER — DEXTROSE-NACL 5-0.45 % IV SOLN: INTRAVENOUS | Status: DC

## 2018-06-17 MED ORDER — PROPOFOL 10 MG/ML IV BOLUS
INTRAVENOUS | Status: AC
  Filled 2018-06-17: qty 20

## 2018-06-17 MED ORDER — KETOROLAC TROMETHAMINE 30 MG/ML IJ SOLN: 30.0000 mg | Freq: Once | INTRAMUSCULAR | Status: DC

## 2018-06-17 MED ORDER — ENALAPRIL MALEATE 10 MG PO TABS
10.0000 mg | ORAL_TABLET | Freq: Every day | ORAL | Status: DC
  Administered 2018-06-17: 10 mg via ORAL
  Filled 2018-06-17: qty 1

## 2018-06-17 MED ORDER — ROCURONIUM BROMIDE 10 MG/ML (PF) SYRINGE
PREFILLED_SYRINGE | INTRAVENOUS | Status: AC
  Filled 2018-06-17: qty 10

## 2018-06-17 MED ORDER — OXYCODONE-ACETAMINOPHEN 5-325 MG PO TABS
1.0000 | ORAL_TABLET | ORAL | Status: DC | PRN
  Administered 2018-06-17 – 2018-06-18 (×2): 1 via ORAL

## 2018-06-17 MED ORDER — FENTANYL CITRATE (PF) 100 MCG/2ML IJ SOLN
INTRAMUSCULAR | Status: AC
  Filled 2018-06-17: qty 2

## 2018-06-17 MED ORDER — SODIUM CHLORIDE 0.9 % IJ SOLN
INTRAMUSCULAR | Status: AC
  Filled 2018-06-17: qty 10

## 2018-06-17 MED ORDER — ROCURONIUM BROMIDE 100 MG/10ML IV SOLN
INTRAVENOUS | Status: DC | PRN
  Administered 2018-06-17: 10 mg via INTRAVENOUS
  Administered 2018-06-17: 50 mg via INTRAVENOUS

## 2018-06-17 MED ORDER — PROPOFOL 10 MG/ML IV BOLUS
INTRAVENOUS | Status: DC | PRN
  Administered 2018-06-17: 120 mg via INTRAVENOUS

## 2018-06-17 SURGICAL SUPPLY — 66 items
APPLICATOR ARISTA FLEXITIP XL (MISCELLANEOUS) ×3 IMPLANT
APPLIER CLIP 5 13 M/L LIGAMAX5 (MISCELLANEOUS) ×3
BARRIER ADHS 3X4 INTERCEED (GAUZE/BANDAGES/DRESSINGS) IMPLANT
CANISTER SUCT 3000ML PPV (MISCELLANEOUS) ×3 IMPLANT
CATH FOLEY 3WAY  5CC 16FR (CATHETERS) ×1
CATH FOLEY 3WAY 5CC 16FR (CATHETERS) ×2 IMPLANT
CLIP APPLIE 5 13 M/L LIGAMAX5 (MISCELLANEOUS) ×2 IMPLANT
COVER BACK TABLE 60X90IN (DRAPES) ×3 IMPLANT
COVER TIP SHEARS 8 DVNC (MISCELLANEOUS) ×2 IMPLANT
COVER TIP SHEARS 8MM DA VINCI (MISCELLANEOUS) ×1
DECANTER SPIKE VIAL GLASS SM (MISCELLANEOUS) ×3 IMPLANT
DEFOGGER SCOPE WARMER CLEARIFY (MISCELLANEOUS) ×3 IMPLANT
DERMABOND ADVANCED (GAUZE/BANDAGES/DRESSINGS) ×1
DERMABOND ADVANCED .7 DNX12 (GAUZE/BANDAGES/DRESSINGS) ×2 IMPLANT
DRAPE ARM DVNC X/XI (DISPOSABLE) ×6 IMPLANT
DRAPE COLUMN DVNC XI (DISPOSABLE) ×2 IMPLANT
DRAPE DA VINCI XI ARM (DISPOSABLE) ×3
DRAPE DA VINCI XI COLUMN (DISPOSABLE) ×1
DURAPREP 26ML APPLICATOR (WOUND CARE) ×3 IMPLANT
ELECT REM PT RETURN 15FT ADLT (MISCELLANEOUS) ×3 IMPLANT
GLOVE BIO SURGEON STRL SZ7 (GLOVE) ×6 IMPLANT
GLOVE BIOGEL PI IND STRL 7.0 (GLOVE) ×10 IMPLANT
GLOVE BIOGEL PI INDICATOR 7.0 (GLOVE) ×5
GOWN STRL REUS W/TWL XL LVL3 (GOWN DISPOSABLE) ×3 IMPLANT
GYRUS RUMI II 2.5CM BLUE (DISPOSABLE)
GYRUS RUMI II 3.5CM BLUE (DISPOSABLE)
GYRUS RUMI II 4.0CM BLUE (DISPOSABLE)
HEMOSTAT ARISTA ABSORB 3G PWDR (MISCELLANEOUS) ×3 IMPLANT
IRRIG SUCT STRYKERFLOW 2 WTIP (MISCELLANEOUS) ×3
IRRIGATION SUCT STRKRFLW 2 WTP (MISCELLANEOUS) ×2 IMPLANT
NEEDLE INSUFFLATION 120MM (ENDOMECHANICALS) ×3 IMPLANT
OBTURATOR OPTICAL STANDARD 8MM (TROCAR) ×1
OBTURATOR OPTICAL STND 8 DVNC (TROCAR) ×2
OBTURATOR OPTICALSTD 8 DVNC (TROCAR) ×2 IMPLANT
OCCLUDER COLPOPNEUMO (BALLOONS) ×3 IMPLANT
PACK ROBOT WH (CUSTOM PROCEDURE TRAY) ×3 IMPLANT
PACK ROBOTIC GOWN (GOWN DISPOSABLE) ×3 IMPLANT
PACK TRENDGUARD 450 HYBRID PRO (MISCELLANEOUS) ×2 IMPLANT
PAD PREP 24X48 CUFFED NSTRL (MISCELLANEOUS) ×3 IMPLANT
POSITIONER SURGICAL ARM (MISCELLANEOUS) ×6 IMPLANT
RUMI II 3.0CM BLUE KOH-EFFICIE (DISPOSABLE) IMPLANT
RUMI II GYRUS 2.5CM BLUE (DISPOSABLE) IMPLANT
RUMI II GYRUS 3.5CM BLUE (DISPOSABLE) IMPLANT
RUMI II GYRUS 4.0CM BLUE (DISPOSABLE) IMPLANT
SEAL CANN UNIV 5-8 DVNC XI (MISCELLANEOUS) ×6 IMPLANT
SEAL XI 5MM-8MM UNIVERSAL (MISCELLANEOUS) ×3
SEALER VESSEL DA VINCI XI (MISCELLANEOUS)
SEALER VESSEL EXT DVNC XI (MISCELLANEOUS) IMPLANT
SET CYSTO W/LG BORE CLAMP LF (SET/KITS/TRAYS/PACK) ×3 IMPLANT
SET TRI-LUMEN FLTR TB AIRSEAL (TUBING) ×3 IMPLANT
SUT VIC AB 0 CT1 27 (SUTURE) ×2
SUT VIC AB 0 CT1 27XBRD ANBCTR (SUTURE) ×4 IMPLANT
SUT VICRYL 0 UR6 27IN ABS (SUTURE) IMPLANT
SUT VICRYL 4-0 PS2 18IN ABS (SUTURE) ×6 IMPLANT
SUT VLOC 180 0 9IN  GS21 (SUTURE) ×2
SUT VLOC 180 0 9IN GS21 (SUTURE) ×4 IMPLANT
SYSTEM CARTER THOMASON II (TROCAR) IMPLANT
TIP RUMI ORANGE 6.7MMX12CM (TIP) IMPLANT
TIP UTERINE 5.1X6CM LAV DISP (MISCELLANEOUS) IMPLANT
TIP UTERINE 6.7X10CM GRN DISP (MISCELLANEOUS) IMPLANT
TIP UTERINE 6.7X6CM WHT DISP (MISCELLANEOUS) IMPLANT
TIP UTERINE 6.7X8CM BLUE DISP (MISCELLANEOUS) ×3 IMPLANT
TOWEL OR 17X26 10 PK STRL BLUE (TOWEL DISPOSABLE) ×6 IMPLANT
TRENDGUARD 450 HYBRID PRO PACK (MISCELLANEOUS) ×3
TROCAR PORT AIRSEAL 5X120 (TROCAR) ×3 IMPLANT
WATER STERILE IRR 1000ML POUR (IV SOLUTION) ×3 IMPLANT

## 2018-06-17 NOTE — Anesthesia Postprocedure Evaluation (Signed)
Anesthesia Post Note  Patient: Jenna Nichols  Procedure(s) Performed: XI ROBOTIC ASSISTED TOTAL HYSTERECTOMY WITH BILATERAL SALPINGECTOMY AND RIGHT OOPHORECTOMY (Bilateral Abdomen) CYSTOSCOPY (N/A Bladder)     Patient location during evaluation: PACU Anesthesia Type: General Level of consciousness: awake and alert Pain management: pain level controlled Vital Signs Assessment: post-procedure vital signs reviewed and stable Respiratory status: spontaneous breathing, nonlabored ventilation, respiratory function stable and patient connected to nasal cannula oxygen Cardiovascular status: blood pressure returned to baseline and stable Postop Assessment: no apparent nausea or vomiting Anesthetic complications: no    Last Vitals:  Vitals:   06/17/18 1545 06/17/18 1600  BP: (!) 155/76   Pulse: 69 67  Resp: 13 12  Temp:  36.8 C  SpO2: 100% 100%    Last Pain:  Vitals:   06/17/18 1600  TempSrc:   PainSc: 6                  [name]

## 2018-06-17 NOTE — Op Note (Signed)
06/17/2018  3:25 PM  PATIENT:  Jenna Nichols  47 y.o. female  PRE-OPERATIVE DIAGNOSIS:  Uterine Fibroids AUB Pelvic Pain  POST-OPERATIVE DIAGNOSIS:  uterine fibroids, AUB, pelvic pain  PROCEDURE:  Procedure(s): XI ROBOTIC ASSISTED TOTAL HYSTERECTOMY WITH BILATERAL SALPINGECTOMY AND RIGHT OOPHORECTOMY (Bilateral) CYSTOSCOPY (N/A)  SURGEON:  Surgeon(s) and Role:    * Lavonia Drafts, MD - Primary    * Anyanwu, Sallyanne Havers, MD - Assisting  ANESTHESIA:   general  EBL:  75 mL   BLOOD ADMINISTERED:none  DRAINS: none   LOCAL MEDICATIONS USED:  MARCAINE     SPECIMEN:  Source of Specimen:  right ovary; bilateral fallopian tubes, uterus and cervix.   DISPOSITION OF SPECIMEN:  PATHOLOGY  COUNTS:  YES  TOURNIQUET:  * No tourniquets in log *  DICTATION: .Note written in [place]: Admit for overnight observation  PATIENT DISPOSITION:  PACU - hemodynamically stable.   Delay start of Pharmacological VTE agent (>24hrs) due to surgical blood loss or risk of bleeding: yes  Complications: none immediate  Pt is a 47 yo female with symptomatic uterine fibroids leading to heavy bleeding and anemia.    Findings: Enlarged uterus with fibroids noted.  The uterus was also boggy. The right ovarian cyst that was noted on the [place] was not noted.  There were multiple small clear cysts on the fallopian tubes. The right ovary had excrescences on it. There was a peritoneal window in the cul de sac and a peritoneal lesion also in the cul-de-sac. This appeared to be endometriosis. The appendix was adherent to the adnexa on the right. Gen surgery came in for a intraop consult and felt that it looked benign . There was a benign appearing cyst on the endHere      The risks, benefits, and alternatives of surgery were explained, understood, and accepted. Consents were signed. All questions were answered. She was taken to the operating room and general anesthesia was applied without complication.  She was placed in the dorsal lithotomy position and her abdomen and vagina were prepped and draped after she had been carefully positioned on the table. A bimanual exam revealed a 12 week size uterus that was mobile. Her adnexa were not enlarged.   The cervix was measured and the uterus was sounded to 10 cm. A Rumi uterine manipulator was placed without difficulty. A Foley catheter was placed and it drained clear throughout the case. Gloves were changed and attention was turned to the abdomen. A Veress needle was placed through the umbilicus and into the peritoneal cavity. Placement was confirmed with the water filled syringe. CO2 was used to insufflate the abdomen to approximately 4 L. After good pneumoperitoneum was established, a 8 mm trocar was placed 5 cm above the umbilicus.  Laparoscopy confirmed correct placement. She was placed in Trendelenburg position and ports were placed in appropriate positions on her abdomen to allow maximum exposure during the robotic case. Specifically there was an 110mm assistant port placed in the left upper quadrant under direct laproscopic visualization. Two 8 mm ports were placed 8cm lateral to the midline port.  These were all placed under direct laparoscopic visualization. The robot was docked and I proceeded with a robotic portion of the case.  The pelvis was inspected and the uterus was found to have fibroids and be enlarged. The appendix was adherent to the fallopian tube on the right side with film adhesions that were transected sharply.  The ureters and the infundibulopelvic ligaments were  identified. I excised the fallopian tubes bilaterally. The IP ligament on the right side was transected and ligated. The uteroovarian ligament on the left side was transected and ligated.  The round ligament on each side was cauterized and cut. The Vessel sealer was used for this portion. The round ligaments were identified, cauterized and ligated, a bladder flap was created  anteriorly. The uterine vessels were identified and cauterized and then cut.The bladder was pushed out of the operative site and an anterior colpotomy was made. The colpotomy incision was extended circumferentially, following the blue outline of the Rumi manipulator. All pedicles were hemostatic.  The uterus was removed from the vagina with the fallopian tube segments and the ovary on the right side.  The vaginal cuff was closed with v-lock suture.  Excellent hemostasis was noted throughout. The pelvis was irrigated. The intraabdominal pressure was lowered assess hemostasis. After determining excellent hemostasis, the robot was undocked. At this point I performed cystoscopy. The cystoscopy revealed blue ejection from both ureters.  The midline fascial incision was closed with 0 vicryl.  The skin from all of the other ports was closed with 3-0 vicryl. 30cc of 0.5% Marcaine was injected into the port sites.  The patient was then extubated and taken to recovery in stable condition.   Sponge, lap and needle counts were correct x 2.  Pt will be admitted for overnight observation.  Kasin Tonkinson L. Harraway-Smith, M.D., [facility] June

## 2018-06-17 NOTE — Anesthesia Preprocedure Evaluation (Addendum)
Anesthesia Evaluation  Patient identified by MRN, date of birth, ID band Patient awake    Reviewed: Allergy & Precautions, NPO status , Patient's Chart, lab work & pertinent test results  History of Anesthesia Complications Negative for: history of anesthetic complications  Airway Mallampati: I  TM Distance: >3 FB Neck ROM: Full    Dental  (+) Dental Advisory Given,  Teeth intact other than missing noted above:   Pulmonary Current Smoker,    breath sounds clear to auscultation       Cardiovascular hypertension, Pt. on medications  Rhythm:Regular Rate:Normal  EKG 06/11/18 NSR   Neuro/Psych  Headaches, negative psych ROS   GI/Hepatic negative GI ROS, Neg liver ROS,   Endo/Other  Hypothyroidism Thyroid cancer s/p thyroidectomy  Renal/GU negative Renal ROS  negative genitourinary   Musculoskeletal negative musculoskeletal ROS (+)   Abdominal   Peds  Hematology  (+) anemia ,   Anesthesia Other Findings   Reproductive/Obstetrics Uterine fibroids, pelvic pain                          Anesthesia Physical Anesthesia Plan  ASA: II  Anesthesia Plan: General   Post-op Pain Management:    Induction: Intravenous  PONV Risk Score and Plan: 1 and Ondansetron, Dexamethasone, Treatment may vary due to age or medical condition and Midazolam  Airway Management Planned: Oral ETT  Additional Equipment:   Intra-op Plan:   Post-operative Plan: Extubation in OR  Informed Consent: I have reviewed the patients History and Physical, chart, labs and discussed the procedure including the risks, benefits and alternatives for the proposed anesthesia with the patient or authorized representative who has indicated his/her understanding and acceptance.   Dental advisory given  Plan Discussed with: CRNA  Anesthesia Plan Comments:        Anesthesia Quick Evaluation

## 2018-06-17 NOTE — H&P (Signed)
Preoperative History and Physical  Jenna Nichols is a 47 y.o. G3P3 here for surgical management of AUB, right ovarian cyst and uterine fibroids.   Proposed surgery: Robot assisted total laparoscopic hysterectomy with right salpingo-oophorectomy and left salpingectomy  Past Medical History:  Diagnosis Date  . Anemia   . 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 Headache   . Hypertension   . Thyroid disease    Past Surgical History:  Procedure Laterality Date  . THYROIDECTOMY    . TUBAL LIGATION     OB History    Gravida  3   Para  3   Term      Preterm      AB      Living  2     SAB      TAB      Ectopic      Multiple      Live Births             Patient denies any cervical dysplasia or STIs. Medications Prior to Admission  Medication Sig Dispense Refill Last Dose  . calcium carbonate (TUMS EX) 750 MG chewable tablet Chew 2 tablets by mouth daily.   Past Week at Unknown time  . enalapril (VASOTEC) 10 MG tablet Take 1 tablet (10 mg total) by mouth daily. 90 tablet 0 06/16/2018 at 1300  . Fe Fum-FePoly-FA-Vit C-Vit B3 (INTEGRA F) 125-1 MG CAPS Take 1 capsule by mouth daily. 60 capsule 1 06/16/2018 at 1300  . ibuprofen (ADVIL,MOTRIN) 800 MG tablet Take 1 tablet (800 mg total) by mouth 3 (three) times daily with meals as needed for headache or moderate pain. 30 tablet 3 Past Week at Unknown time  . levothyroxine (SYNTHROID) 137 MCG tablet Take 137 mcg by mouth daily before breakfast.    06/16/2018 at 1800  . Tetrahydrozoline HCl (VISINE OP) Place 2 drops into both eyes daily as needed (for dry eyes).   Past Week at Unknown time  . HYDROcodone-acetaminophen (NORCO) 5-325 MG tablet Take 1 tablet by mouth every 6 (six) hours as needed for severe pain. (Patient not taking: Reported on 11/08/2016) 6 tablet 0 Not Taking at Unknown time  . megestrol (MEGACE) 40 MG tablet Take 2 tablets (80 mg total) by mouth daily. Can increase to two tablets twice a day in the event of heavy  bleeding (Patient not taking: Reported on 11/15/2017) 60 tablet 5 Completed Course at Unknown time  . norethindrone (MICRONOR,CAMILA,ERRIN) 0.35 MG tablet Take 1 tablet (0.35 mg total) by mouth daily. (Patient not taking: Reported on 05/02/2018) 1 Package 11 Not Taking at Unknown time    No Known Allergies Social History:   reports that she has been smoking.  She has been smoking about 1.00 pack per day. She has never used smokeless tobacco. She reports that she drinks alcohol. She reports that she has current or past drug history. Drug: Marijuana. Family History  Problem Relation Age of Onset  . Diabetes Mother   . Cancer Brother     Review of Systems: Noncontributory  PHYSICAL EXAM: Blood pressure (!) 145/96, pulse 61, temperature 98.4 F (36.9 C), temperature source Oral, resp. rate 16, height 5' 7.25" (1.708 m), weight 183 lb (83 kg), SpO2 98 %. General appearance - alert, well appearing, and in no distress Chest - clear to auscultation, no wheezes, rales or rhonchi, symmetric air entry Heart - normal rate and regular rhythm Abdomen - soft, nontender, nondistended, no masses or organomegaly Pelvic -  examination not indicated Extremities - peripheral pulses normal, no pedal edema, no clubbing or cyanosis  Labs: Results for orders placed or performed during the hospital encounter of 06/17/18 (from the past 336 hour(s))  Pregnancy, urine   Collection Time: 06/17/18 10:12 AM  Result Value Ref Range   Preg Test, Ur NEGATIVE NEGATIVE  CBC   Collection Time: 06/17/18 10:48 AM  Result Value Ref Range   WBC 5.6 4.0 - 10.5 K/uL   RBC 4.30 3.87 - 5.11 MIL/uL   Hemoglobin 10.5 (L) 12.0 - 15.0 g/dL   HCT 33.9 (L) 36.0 - 46.0 %   MCV 78.8 78.0 - 100.0 fL   MCH 24.4 (L) 26.0 - 34.0 pg   MCHC 31.0 30.0 - 36.0 g/dL   RDW 28.3 (H) 11.5 - 15.5 %   Platelets 371 150 - 400 K/uL  Type and screen [place]   Collection Time: 06/17/18 10:48 AM  Result Value Ref Range    ABO/RH(D) O POS    Antibody Screen NEG    Sample Expiration      06/20/2018 Performed at [facility], [address] [place] [place], [place] [address]    Unit Number [id number]    Blood Component Type RED CELLS,LR    Unit division 00    Status of Unit ALLOCATED    Transfusion Status OK TO TRANSFUSE    Crossmatch Result Compatible    Unit Number [id number]    Blood Component Type RED CELLS,LR    Unit division 00    Status of Unit ALLOCATED    Transfusion Status OK TO TRANSFUSE    Crossmatch Result Compatible   Prepare RBC (crossmatch)   Collection Time: 06/17/18 10:48 AM  Result Value Ref Range   Order Confirmation      ORDER PROCESSED BY BLOOD BANK Performed at [facility], [place] [address] [place], [place] [address]   ABO/Rh   Collection Time: 06/17/18 10:48 AM  Result Value Ref Range   ABO/RH(D)      O POS Performed at [facility], [place] [address], [place], [place] [address]   BPAM [facility]   Collection Time: 06/17/18 10:48 AM  Result Value Ref Range   Blood Product Unit Number [id number]    Unit Type and Rh 5100    Blood Product Expiration Date 081448185631    Blood Product Unit Number [id number]    Unit Type and Rh 5100    Blood Product Expiration Date 027741287867   Results for orders placed or performed in visit on 06/16/18 (from the past 336 hour(s))  T3, free   Collection Time: 06/16/18  3:30 PM  Result Value Ref Range   T3, Free 2.2 2.0 - 4.4 pg/mL  T4, free   Collection Time: 06/16/18  3:30 PM  Result Value Ref Range   Free T4 1.07 0.82 - 1.77 ng/dL  Results for orders placed or performed during the hospital encounter of 06/11/18 (from the past 336 hour(s))  CBC   Collection Time: 06/11/18  3:20 PM  Result Value Ref Range   WBC 5.8 4.0 - 10.5 K/uL   RBC 3.93 3.87 - 5.11 MIL/uL   Hemoglobin 9.1 (L) 12.0 - 15.0 g/dL   HCT 29.8 (L) 36.0 - 46.0 %   MCV 75.8 (L) 78.0 - 100.0 fL   MCH 23.2 (L)  26.0 - 34.0 pg   MCHC 30.5 30.0 - 36.0 g/dL   RDW 25.1 (H) 11.5 - 15.5 %  Platelets 343 150 - 400 K/uL  Basic metabolic panel   Collection Time: 06/11/18  3:20 PM  Result Value Ref Range   Sodium 141 135 - 145 mmol/L   Potassium 4.5 3.5 - 5.1 mmol/L   Chloride 109 98 - 111 mmol/L   CO2 27 22 - 32 mmol/L   Glucose, Bld 86 70 - 99 mg/dL   BUN 14 6 - 20 mg/dL   Creatinine, Ser 0.77 0.44 - 1.00 mg/dL   Calcium 8.8 (L) 8.9 - 10.3 mg/dL   GFR calc non Af Amer >60 >60 mL/min   GFR calc Af Amer >60 >60 mL/min   Anion gap 5 5 - 15  hCG, serum, qualitative   Collection Time: 06/11/18  3:20 PM  Result Value Ref Range   Preg, Serum NEGATIVE NEGATIVE  Results for orders placed or performed in visit on 06/05/18 (from the past 336 hour(s))  TSH   Collection Time: 06/05/18 10:25 AM  Result Value Ref Range   TSH 6.550 (H) 0.450 - 4.500 uIU/mL  CBC   Collection Time: 06/05/18 10:25 AM  Result Value Ref Range   WBC 5.5 3.4 - 10.8 x10E3/uL   RBC 3.99 3.77 - 5.28 x10E6/uL   Hemoglobin 8.2 (L) 11.1 - 15.9 g/dL   Hematocrit 29.2 (L) 34.0 - 46.6 %   MCV 73 (L) 79 - 97 fL   MCH 20.6 (L) 26.6 - 33.0 pg   MCHC 28.1 (L) 31.5 - 35.7 g/dL   RDW 23.1 (H) 12.3 - 15.4 %   Platelets 426 150 - 450 x10E3/uL    Imaging Studies: 05/06/2018 CLINICAL DATA:  Patient with history of uterine fibroids.  EXAM: TRANSABDOMINAL AND TRANSVAGINAL ULTRASOUND OF PELVIS  TECHNIQUE: Both transabdominal and transvaginal ultrasound examinations of the pelvis were performed. Transabdominal technique was performed for global imaging of the pelvis including uterus, ovaries, adnexal regions, and pelvic cul-de-sac. It was necessary to proceed with endovaginal exam following the transabdominal exam to visualize the endometrium.  COMPARISON:  Pelvic ultrasound 10/17/2016  FINDINGS: Uterus  Measurements: 12.0 x 6.1 x 7.0 cm. Multiple uterine fibroids are redemonstrated. Unchanged intramural fibroid anterior  uterine fundus measuring 1.5 x 1.5 x 1.4 cm. Unchanged 3.8 x 3.5 x 3.2 cm fibroid within the anterior uterine body. Unchanged 2.0 x 1.9 x 1.5 cm fibroid within the posterior uterine body.  Endometrium  Thickness: 10 mm.  No focal abnormality visualized.  Right ovary  Measurements: 7.3 x 3.3 x 2.6 cm. Within the right adnexa there is a 4.1 x 2.8 x 3.4 cm cystic structure with internal septations.  Left ovary  Measurements: 3.2 x 1.8 x 2.7 cm. Normal appearance/no adnexal mass.  Other findings  Trace fluid in the pelvis.  IMPRESSION: 1. Multiple uterine fibroids as above, overall similar when compared to prior exam. 2. Within the right adnexa there is a 4.1 cm complicated cystic structure with internal septations, potentially representing an ovarian cyst. Recommend follow-up pelvic ultrasound in 3 months to assess for interval change/resolution. If this lesion persists, surgical consultation may be warranted, depending upon sonographic Characteristics.  Assessment: Patient Active Problem List   Diagnosis Date Noted  . Anemia due to chronic blood loss 06/16/2018  . Abnormal uterine bleeding (AUB) 06/16/2018  . Elevated TSH 06/16/2018  . Dysmenorrhea 05/02/2018  . Menometrorrhagia 05/02/2018  . Smoker 05/02/2018  . Uterine leiomyoma 10/17/2016  . Essential hypertension 10/17/2016  . Malignant neoplasm of thyroid gland ([place]) 07/14/2010    Plan: Patient will undergo surgical management  with Robot assisted total laparoscopic hysterectomy with right salpingo-oophorectomy and left salpingectomy. The risks of surgery were discussed in detail with the patient including but not limited to: bleeding which may require transfusion or reoperation; infection which may require antibiotics; injury to surrounding organs which may involve bowel, bladder, ureters ; need for additional procedures including laparoscopy or laparotomy; thromboembolic phenomenon, surgical site problems  and other postoperative/anesthesia complications. Likelihood of success in alleviating the patient's condition was discussed. Routine postoperative instructions will be reviewed with the patient and her family in detail after surgery.  The patient concurred with the proposed plan, giving informed written consent for the surgery.  Patient has been NPO since last night she will remain NPO for procedure.  Anesthesia and OR aware.  Preoperative prophylactic antibiotics and SCDs ordered on call to the OR.  To OR when ready.  Catarina Huntley L. Ihor Dow, M.D., [facility] 06/17/2018 12:14 PM

## 2018-06-17 NOTE — Brief Op Note (Signed)
06/17/2018  3:25 PM  PATIENT:  Jenna Nichols  47 y.o. female  PRE-OPERATIVE DIAGNOSIS:  Uterine Fibroids AUB Pelvic Pain  POST-OPERATIVE DIAGNOSIS:  uterine fibroids, AUB, pelvic pain  PROCEDURE:  Procedure(s): XI ROBOTIC ASSISTED TOTAL HYSTERECTOMY WITH BILATERAL SALPINGECTOMY AND RIGHT OOPHORECTOMY (Bilateral) CYSTOSCOPY (N/A)  SURGEON:  Surgeon(s) and Role:    * Lavonia Drafts, MD - Primary    * Anyanwu, Sallyanne Havers, MD - Assisting  ANESTHESIA:   general  EBL:  75 mL   BLOOD ADMINISTERED:none  DRAINS: none   LOCAL MEDICATIONS USED:  MARCAINE     SPECIMEN:  Source of Specimen:  right ovary; bilateral fallopian tubes, uterus and cervix.   DISPOSITION OF SPECIMEN:  PATHOLOGY  COUNTS:  YES  TOURNIQUET:  * No tourniquets in log *  DICTATION: .Note written in [place]: Admit for overnight observation  PATIENT DISPOSITION:  PACU - hemodynamically stable.   Delay start of Pharmacological VTE agent (>24hrs) due to surgical blood loss or risk of bleeding: yes  Complications: none immediate  Monea Pesantez L. Harraway-Smith, M.D., [facility]nn June

## 2018-06-17 NOTE — Anesthesia Procedure Notes (Signed)
Procedure Name: Intubation Date/Time: 06/17/2018 1:02 PM Performed by: Raenette Rover, CRNA Pre-anesthesia Checklist: Patient identified, Emergency Drugs available, Suction available and Patient being monitored Patient Re-evaluated:Patient Re-evaluated prior to induction Oxygen Delivery Method: Circle system utilized Preoxygenation: Pre-oxygenation with 100% oxygen Induction Type: IV induction Ventilation: Mask ventilation without difficulty Laryngoscope Size: Mac and 3 Grade View: Grade I Tube type: Oral Tube size: 7.0 mm Number of attempts: 1 Airway Equipment and Method: Stylet Placement Confirmation: ETT inserted through vocal cords under direct vision,  positive ETCO2,  CO2 detector and breath sounds checked- equal and bilateral Secured at: 23 cm Tube secured with: Tape Dental Injury: Teeth and Oropharynx as per pre-operative assessment

## 2018-06-17 NOTE — Transfer of Care (Signed)
Immediate Anesthesia Transfer of Care Note  Patient: Jenna Nichols  Procedure(s) Performed: XI ROBOTIC ASSISTED TOTAL HYSTERECTOMY WITH BILATERAL SALPINGECTOMY AND RIGHT OOPHORECTOMY (Bilateral Abdomen) CYSTOSCOPY (N/A Bladder)  Patient Location: PACU  Anesthesia Type:General  Level of Consciousness: awake, alert , oriented, drowsy and patient cooperative  Airway & Oxygen Therapy: Patient Spontanous Breathing and Patient connected to face mask oxygen  Post-op Assessment: Report given to RN and Post -op Vital signs reviewed and stable  Post vital signs: Reviewed and stable  Last Vitals:  Vitals Value Taken Time  BP 148/97 06/17/2018  3:20 PM  Temp 36.9 C 06/17/2018  3:20 PM  Pulse 73 06/17/2018  3:24 PM  Resp 13 06/17/2018  3:24 PM  SpO2 100 % 06/17/2018  3:24 PM  Vitals shown include unvalidated device data.  Last Pain:  Vitals:   06/17/18 1037  TempSrc:   PainSc: 2       Patients Stated Pain Goal: 4 (88/[id number])  Complications: No apparent anesthesia complications

## 2018-06-17 NOTE — OR Nursing (Signed)
Dr. Ihor Dow asked if a general surgeon could be called in to look at pt. Appendix. Called front desk and Dr. Dalbert Batman will be here in 10 minutes.

## 2018-06-18 ENCOUNTER — Encounter (HOSPITAL_COMMUNITY): Payer: Self-pay | Admitting: Obstetrics & Gynecology

## 2018-06-18 DIAGNOSIS — D259 Leiomyoma of uterus, unspecified: Secondary | ICD-10-CM | POA: Diagnosis not present

## 2018-06-18 LAB — BASIC METABOLIC PANEL
ANION GAP: 8 (ref 5–15)
BUN: 7 mg/dL (ref 6–20)
CHLORIDE: 106 mmol/L (ref 98–111)
CO2: 22 mmol/L (ref 22–32)
Calcium: 8.3 mg/dL — ABNORMAL LOW (ref 8.9–10.3)
Creatinine, Ser: 0.73 mg/dL (ref 0.44–1.00)
GFR calc non Af Amer: >60 mL/min (ref >60)
GLUCOSE: 124 mg/dL — AB (ref 70–99)
Potassium: 3.9 mmol/L (ref 3.5–5.1)
Sodium: 136 mmol/L (ref 135–145)

## 2018-06-18 LAB — CBC
HEMATOCRIT: 29.6 % — AB (ref 36.0–46.0)
HEMOGLOBIN: 9 g/dL — AB (ref 12.0–15.0)
MCH: 24.3 pg — ABNORMAL LOW (ref 26.0–34.0)
MCHC: 30.4 g/dL (ref 30.0–36.0)
MCV: 80 fL (ref 78.0–100.0)
Platelets: 320 10*3/uL (ref 150–400)
RBC: 3.7 MIL/uL — ABNORMAL LOW (ref 3.87–5.11)
RDW: 28.3 % — ABNORMAL HIGH (ref 11.5–15.5)
WBC: 8.6 10*3/uL (ref 4.0–10.5)

## 2018-06-18 MED ORDER — OXYCODONE-ACETAMINOPHEN 5-325 MG PO TABS
ORAL_TABLET | ORAL | Status: AC
  Filled 2018-06-18: qty 1

## 2018-06-18 MED ORDER — ENOXAPARIN SODIUM 40 MG/0.4ML ~~LOC~~ SOLN
SUBCUTANEOUS | Status: AC
  Filled 2018-06-18: qty 0.4

## 2018-06-18 MED ORDER — IBUPROFEN 600 MG PO TABS: 600.0000 mg | ORAL_TABLET | Freq: Four times a day (QID) | ORAL | 2 refills | Status: DC | PRN

## 2018-06-18 MED ORDER — OXYCODONE-ACETAMINOPHEN 5-325 MG PO TABS: 1.0000 | ORAL_TABLET | ORAL | 0 refills | Status: DC | PRN

## 2018-06-18 MED ORDER — KETOROLAC TROMETHAMINE 30 MG/ML IJ SOLN
INTRAMUSCULAR | Status: AC
  Filled 2018-06-18: qty 1

## 2018-06-18 NOTE — Discharge Summary (Signed)
Physician Discharge Summary  Patient ID: Jenna Nichols MRN: [id number] DOB/AGE: [birth year] 49 y.o.  Admit date: 06/17/2018 Discharge date: 06/18/2018  Admission Diagnoses: see below  Discharge Diagnoses:  Principal Problem:   Dysmenorrhea Active Problems:   Uterine leiomyoma   Essential hypertension   Menometrorrhagia   Smoker   Anemia due to chronic blood loss   Abnormal uterine bleeding (AUB)   Post-operative state   Discharged Condition: good  Hospital Course: Patient had an uncomplicated surgery; for further details of this surgery, please refer to the operative note. Furthermore, the patient had an uncomplicated postoperative course.  By time of discharge, her pain was controlled on oral pain medications; she was ambulating, voiding without difficulty, tolerating regular diet and passing flatus.  She was deemed stable for discharge to home.    Consults: general surgery  intraop consult  Significant Diagnostic Studies: labs: CBC  Treatments: surgery: Robot assisted total lap hyst with RSO and left salpingectomy    Discharge Exam: Blood pressure (!) 155/84, pulse 66, temperature 98.8 F (37.1 C), temperature source Oral, resp. rate 16, height 5' 7.25" (1.708 m), weight 183 lb (83 kg), SpO2 100 %. General appearance: alert and no distress Resp: clear to auscultation bilaterally Cardio: regular rate and rhythm, S1, S2 normal, no murmur, click, rub or gallop GI: soft, non-tender; bowel sounds normal; no masses,  no organomegaly and incision lean dry and intact.  Extremities: extremities normal, atraumatic, no cyanosis or edema  Disposition: Discharge disposition: 01-Home or Self Care       Discharge Instructions    Call MD for:  difficulty breathing, headache or visual disturbances   Complete by:  As directed    Call MD for:  extreme fatigue   Complete by:  As directed    Call MD for:  hives   Complete by:  As directed    Call MD for:  persistant dizziness or  light-headedness   Complete by:  As directed    Call MD for:  persistant nausea and vomiting   Complete by:  As directed    Call MD for:  redness, tenderness, or signs of infection (pain, swelling, redness, odor or green/yellow discharge around incision site)   Complete by:  As directed    Call MD for:  severe uncontrolled pain   Complete by:  As directed    Call MD for:  temperature >100.4   Complete by:  As directed    Diet - low sodium heart healthy   Complete by:  As directed    Driving Restrictions   Complete by:  As directed    No driving for 2 weeks   Increase activity slowly   Complete by:  As directed    Lifting restrictions   Complete by:  As directed    No heavy lifting for 6 weeks   Sexual Activity Restrictions   Complete by:  As directed    No sexual intercourse for 8 (eight) weeks     Allergies as of 06/18/2018   No Known Allergies     Medication List    STOP taking these medications   HYDROcodone-acetaminophen 5-325 MG tablet Commonly known as:  NORCO     TAKE these medications   calcium carbonate 750 MG chewable tablet Commonly known as:  TUMS EX Chew 2 tablets by mouth daily.   enalapril 10 MG tablet Commonly known as:  VASOTEC Take 1 tablet (10 mg total) by mouth daily.   ibuprofen 600 MG tablet Commonly  known as:  ADVIL,MOTRIN Take 1 tablet (600 mg total) by mouth every 6 (six) hours as needed (mild pain). What changed:    medication strength  how much to take  when to take this  reasons to take this   INTEGRA F 125-1 MG Caps Take 1 capsule by mouth daily.   oxyCODONE-acetaminophen 5-325 MG tablet Commonly known as:  PERCOCET/ROXICET Take 1 tablet by mouth every 4 (four) hours as needed for moderate pain ((when tolerating fluids)).   SYNTHROID 137 MCG tablet Generic drug:  levothyroxine Take 137 mcg by mouth daily before breakfast.   VISINE OP Place 2 drops into both eyes daily as needed (for dry eyes).      Follow-up  [facility] Follow up in 2 week(s).   Specialty:  Obstetrics and Gynecology Contact information: [address] [address] [phone]          Signed: Lavonia Drafts 06/18/2018, 8:31 AM

## 2018-06-18 NOTE — Discharge Instructions (Signed)

## 2018-06-19 ENCOUNTER — Ambulatory Visit: Payer: 59 | Admitting: Family Medicine

## 2018-06-21 LAB — TYPE AND SCREEN
ABO/RH(D): O POS
Antibody Screen: NEGATIVE
UNIT DIVISION: 0
Unit division: 0

## 2018-06-21 LAB — BPAM RBC
BLOOD PRODUCT EXPIRATION DATE: 201909052359
BLOOD PRODUCT EXPIRATION DATE: 201909052359
Unit Type and Rh: 5100
Unit Type and Rh: 5100

## 2018-07-04 ENCOUNTER — Ambulatory Visit (INDEPENDENT_AMBULATORY_CARE_PROVIDER_SITE_OTHER): Payer: 59 | Admitting: Obstetrics & Gynecology

## 2018-07-04 ENCOUNTER — Ambulatory Visit: Payer: 59 | Admitting: Family Medicine

## 2018-07-04 VITALS — BP 146/86 | HR 74 | Ht 67.0 in | Wt 185.0 lb

## 2018-07-04 DIAGNOSIS — Z9889 Other specified postprocedural states: Secondary | ICD-10-CM

## 2018-07-04 DIAGNOSIS — Z0289 Encounter for other administrative examinations: Secondary | ICD-10-CM

## 2018-07-04 NOTE — Patient Instructions (Signed)

## 2018-07-04 NOTE — Progress Notes (Signed)
History:  47 y.o. G3P3 here today for her 2 week post op check. Pt is without complaints. She is voiding and passing stools. She denies significant pain and would like to resume work with light duty.   She denies bleeding or hot flushes.   The following portions of the patient's history were reviewed and updated as appropriate: allergies, current medications, past family history, past medical history, past social history, past surgical history and problem list.  Review of Systems:  Pertinent items are noted in HPI.    Objective:  Physical Exam Blood pressure (!) 146/86, pulse 74, height 5\' 7"  (1.702 m), weight 185 lb (83.9 kg).  CONSTITUTIONAL: Well-developed, well-nourished female in no acute distress.  HENT:  Normocephalic, atraumatic EYES: Conjunctivae and EOM are normal. No scleral icterus.  NECK: Normal range of motion SKIN: Skin is warm and dry. No rash noted. Not diaphoretic.No pallor. [place]: Alert and oriented to person, place, and time. Normal coordination.  Abd: Soft, nontender and nondistended; post sites healing well.  Pelvic: deferred  Labs and Imaging Diagnosis 1. Uterus, cervix and bilateral fallopian tubes, right ovary UTERUS: - SECRETORY ENDOMETRIUM - LEIOMYOMATA (5.5 CM; LARGEST) - NO HYPERPLASIA OR MALIGNANCY IDENTIFIED CERVIX: - SQUAMOUS METAPLASIA AND NABOTHIAN CYSTS - NO MALIGNANCY IDENTIFIED RIGHT OVARY: - BENIGN OVARY WITH SIMPLE CYST (0.2 CM) - NO MALIGNANCY IDENTIFIED BILATERAL FALLOPIAN TUBES: - BENIGN FALLOPIAN TUBES WITH PARATUBAL CYST (RIGHT) - NO MALIGNANCY IDENTIFIED 2. Peritoneum, biopsy, implant - FIBROVASCULAR TISSUE WITH PIGMENTED HISTIOCYTES AND CHRONIC INFLAMMATION - NO GLANDULAR EPITHELIUM OR MALIGNANCY IDENTIFIED  Assessment & Plan:  2 week post check. Pt is doing well.   Reviewed surg path  May return to light duty- no lifting, bending or stooping.    emphasized NOTHING PER VAGINA OR RECTUM for 8 full weeks  F/u in 4 weeks or  sooner prn  Gradual increase in activities  Natalee Tomkiewicz L. Harraway-Smith, M.D., Cherlynn June

## 2018-07-07 ENCOUNTER — Ambulatory Visit: Payer: 59 | Admitting: Family Medicine

## 2018-07-07 ENCOUNTER — Encounter: Payer: Self-pay | Admitting: Family Medicine

## 2018-07-07 VITALS — BP 134/92 | HR 79 | Temp 98.6°F | Ht 67.0 in | Wt 185.5 lb

## 2018-07-07 DIAGNOSIS — R51 Headache: Secondary | ICD-10-CM | POA: Diagnosis not present

## 2018-07-07 DIAGNOSIS — I10 Essential (primary) hypertension: Secondary | ICD-10-CM | POA: Diagnosis not present

## 2018-07-07 DIAGNOSIS — Z23 Encounter for immunization: Secondary | ICD-10-CM | POA: Diagnosis not present

## 2018-07-07 DIAGNOSIS — R519 Headache, unspecified: Secondary | ICD-10-CM | POA: Insufficient documentation

## 2018-07-07 NOTE — Patient Instructions (Addendum)
Aim to do some physical exertion for 150 minutes per week. This is typically divided into 5 days per week, 30 minutes per day. The activity should be enough to get your heart rate up. Anything is better than nothing if you have time constraints.  Around 3 times per week, check your blood pressure 4 times per day. Twice in the morning and twice in the evening. The readings should be at least one minute apart. Write down these values and bring them to your next nurse visit/appointment.  When you check your BP, make sure you have been doing something calm/relaxing 5 minutes prior to checking. Both feet should be flat on the floor and you should be sitting. Use your left arm and make sure it is in a relaxed position (on a table), and that the cuff is at the approximate level/height of your heart.  Let us know if you need anything.  DASH Eating Plan DASH stands for "Dietary Approaches to Stop Hypertension." The DASH eating plan is a healthy eating plan that has been shown to reduce high blood pressure (hypertension). It may also reduce your risk for type 2 diabetes, heart disease, and stroke. The DASH eating plan may also help with weight loss. What are tips for following this plan? General guidelines  Avoid eating more than 2,300 mg (milligrams) of salt (sodium) a day. If you have hypertension, you may need to reduce your sodium intake to 1,500 mg a day.  Limit alcohol intake to no more than 1 drink a day for nonpregnant women and 2 drinks a day for men. One drink equals 12 oz of beer, 5 oz of wine, or 1 oz of hard liquor.  Work with your health care provider to maintain a healthy body weight or to lose weight. Ask what an ideal weight is for you.  Get at least 30 minutes of exercise that causes your heart to beat faster (aerobic exercise) most days of the week. Activities may include walking, swimming, or biking.  Work with your health care provider or diet and nutrition specialist (dietitian) to  adjust your eating plan to your individual calorie needs. Reading food labels  Check food labels for the amount of sodium per serving. Choose foods with less than 5 percent of the Daily Value of sodium. Generally, foods with less than 300 mg of sodium per serving fit into this eating plan.  To find whole grains, look for the word "whole" as the first word in the ingredient list. Shopping  Buy products labeled as "low-sodium" or "no salt added."  Buy fresh foods. Avoid canned foods and premade or frozen meals. Cooking  Avoid adding salt when cooking. Use salt-free seasonings or herbs instead of table salt or sea salt. Check with your health care provider or pharmacist before using salt substitutes.  Do not fry foods. Cook foods using healthy methods such as baking, boiling, grilling, and broiling instead.  Cook with heart-healthy oils, such as olive, canola, soybean, or sunflower oil. Meal planning   Eat a balanced diet that includes: ? 5 or more servings of fruits and vegetables each day. At each meal, try to fill half of your plate with fruits and vegetables. ? Up to 6-8 servings of whole grains each day. ? Less than 6 oz of lean meat, poultry, or fish each day. A 3-oz serving of meat is about the same size as a deck of cards. One egg equals 1 oz. ? 2 servings of low-fat dairy each day. ?  A serving of nuts, seeds, or beans 5 times each week. ? Heart-healthy fats. Healthy fats called Omega-3 fatty acids are found in foods such as flaxseeds and coldwater fish, like sardines, salmon, and mackerel.  Limit how much you eat of the following: ? Canned or prepackaged foods. ? Food that is high in trans fat, such as fried foods. ? Food that is high in saturated fat, such as fatty meat. ? Sweets, desserts, sugary drinks, and other foods with added sugar. ? Full-fat dairy products.  Do not salt foods before eating.  Try to eat at least 2 vegetarian meals each week.  Eat more home-cooked  food and less restaurant, buffet, and fast food.  When eating at a restaurant, ask that your food be prepared with less salt or no salt, if possible. What foods are recommended? The items listed may not be a complete list. Talk with your dietitian about what dietary choices are best for you. Grains Whole-grain or whole-wheat bread. Whole-grain or whole-wheat pasta. Brown rice. [place]. Bulgur. Whole-grain and low-sodium cereals. Pita bread. Low-fat, low-sodium crackers. Whole-wheat flour tortillas. Vegetables Fresh or frozen vegetables (raw, steamed, roasted, or grilled). Low-sodium or reduced-sodium tomato and vegetable juice. Low-sodium or reduced-sodium tomato sauce and tomato paste. Low-sodium or reduced-sodium canned vegetables. Fruits All fresh, dried, or frozen fruit. Canned fruit in natural juice (without added sugar). Meat and other protein foods Skinless chicken or [place]. Ground chicken or [place]. Pork with fat trimmed off. Fish and seafood. Egg whites. Dried beans, peas, or lentils. Unsalted nuts, nut butters, and seeds. Unsalted canned beans. Lean cuts of beef with fat trimmed off. Low-sodium, lean deli meat. Dairy Low-fat (1%) or fat-free (skim) milk. Fat-free, low-fat, or reduced-fat cheeses. Nonfat, low-sodium ricotta or cottage cheese. Low-fat or nonfat yogurt. Low-fat, low-sodium cheese. Fats and oils Soft margarine without trans fats. Vegetable oil. Low-fat, reduced-fat, or light mayonnaise and salad dressings (reduced-sodium). Canola, safflower, olive, soybean, and sunflower oils. Avocado. Seasoning and other foods Herbs. Spices. Seasoning mixes without salt. Unsalted popcorn and pretzels. Fat-free sweets. What foods are not recommended? The items listed may not be a complete list. Talk with your dietitian about what dietary choices are best for you. Grains Baked goods made with fat, such as croissants, muffins, or some breads. Dry pasta or rice meal  packs. Vegetables Creamed or fried vegetables. Vegetables in a cheese sauce. Regular canned vegetables (not low-sodium or reduced-sodium). Regular canned tomato sauce and paste (not low-sodium or reduced-sodium). Regular tomato and vegetable juice (not low-sodium or reduced-sodium). Angie Fava. Olives. Fruits Canned fruit in a light or heavy syrup. Fried fruit. Fruit in cream or butter sauce. Meat and other protein foods Fatty cuts of meat. Ribs. Fried meat. Berniece Salines. Sausage. Bologna and other processed lunch meats. Salami. Fatback. Hotdogs. Bratwurst. Salted nuts and seeds. Canned beans with added salt. Canned or smoked fish. Whole eggs or egg yolks. Chicken or [place] with skin. Dairy Whole or 2% milk, cream, and half-and-half. Whole or full-fat cream cheese. Whole-fat or sweetened yogurt. Full-fat cheese. Nondairy creamers. Whipped toppings. Processed cheese and cheese spreads. Fats and oils Butter. Stick margarine. Lard. Shortening. Ghee. Bacon fat. Tropical oils, such as coconut, palm kernel, or palm oil. Seasoning and other foods Salted popcorn and pretzels. Onion salt, garlic salt, seasoned salt, table salt, and sea salt. Worcestershire sauce. Tartar sauce. Barbecue sauce. Teriyaki sauce. Soy sauce, including reduced-sodium. Steak sauce. Canned and packaged gravies. Fish sauce. Oyster sauce. Cocktail sauce. Horseradish that you find on the shelf. Ketchup.  Mustard. Meat flavorings and tenderizers. Bouillon cubes. Hot sauce and Tabasco sauce. Premade or packaged marinades. Premade or packaged taco seasonings. Relishes. Regular salad dressings. Where to find more information:  National Heart, Lung, and [place]: https://wilson-eaton.com/  American Heart Association: www.heart.org Summary  The DASH eating plan is a healthy eating plan that has been shown to reduce high blood pressure (hypertension). It may also reduce your risk for type 2 diabetes, heart disease, and stroke.  With the DASH eating  plan, you should limit salt (sodium) intake to 2,300 mg a day. If you have hypertension, you may need to reduce your sodium intake to 1,500 mg a day.  When on the DASH eating plan, aim to eat more fresh fruits and vegetables, whole grains, lean proteins, low-fat dairy, and heart-healthy fats.  Work with your health care provider or diet and nutrition specialist (dietitian) to adjust your eating plan to your individual calorie needs. This information is not intended to replace advice given to you by your health care provider. Make sure you discuss any questions you have with your health care provider. Document Released: 10/18/2011 Document Revised: 10/22/2016 Document Reviewed: 10/22/2016 Elsevier Interactive Patient Education  [employer].

## 2018-07-07 NOTE — Progress Notes (Signed)
Chief Complaint  Patient presents with  . New Patient (Initial Visit)       New Patient Visit SUBJECTIVE: HPI: Jenna Nichols is an 47 y.o.female who is being seen for establishing care.   The patient was previously seen at only her GYN's office.  L sided headaches toward the back on a daily basis. This has been going on for 1 yr. Ibuprofen has been helpful. Nothing else makes it better. Nothing seems to make it worse. Lasts for around an hour. No neurologic s/s's. No auras, N/V.   Hypertension Patient presents for hypertension follow up. She does not monitor home blood pressures. She is compliant with medication- enalapril 10 mg/d. Patient has these side effects of medication: none She is not adhering to a healthy diet overall. Exercise: goes to [employer]   No Known Allergies  Past Medical History:  Diagnosis Date  . Anemia   . 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 Headache   . Hypertension   . Thyroid disease    Past Surgical History:  Procedure Laterality Date  . CYSTOSCOPY N/A 06/17/2018   Procedure: CYSTOSCOPY;  Surgeon: Lavonia Drafts, MD;  Location: [facility];  Service: Gynecology;  Laterality: N/A;  . ROBOTIC ASSISTED TOTAL HYSTERECTOMY WITH BILATERAL SALPINGO OOPHERECTOMY Bilateral 06/17/2018   Procedure: XI ROBOTIC ASSISTED TOTAL HYSTERECTOMY WITH BILATERAL SALPINGECTOMY AND RIGHT OOPHORECTOMY;  Surgeon: Lavonia Drafts, MD;  Location: [facility];  Service: Gynecology;  Laterality: Bilateral;  . THYROIDECTOMY    . TUBAL LIGATION     Family History  Problem Relation Age of Onset  . Diabetes Mother   . Cancer Brother    No Known Allergies  Current Outpatient Medications:  .  calcium carbonate (TUMS EX) 750 MG chewable tablet, Chew 2 tablets by mouth daily., Disp: , Rfl:  .  enalapril (VASOTEC) 10 MG tablet, Take 1 tablet (10 mg total) by mouth daily., Disp: 90 tablet, Rfl: 0 .  Fe Fum-FePoly-FA-Vit C-Vit B3 (INTEGRA F) 125-1 MG CAPS, Take 1 capsule  by mouth daily., Disp: 60 capsule, Rfl: 1 .  ibuprofen (ADVIL,MOTRIN) 600 MG tablet, Take 1 tablet (600 mg total) by mouth every 6 (six) hours as needed (mild pain)., Disp: 30 tablet, Rfl: 2 .  levothyroxine (SYNTHROID) 137 MCG tablet, Take 137 mcg by mouth daily before breakfast. , Disp: , Rfl:   ROS Cardiovascular: Denies chest pain  Respiratory: Denies dyspnea   OBJECTIVE: BP (!) 134/92 (BP Location: Left Arm, Patient Position: Sitting, Cuff Size: Normal)   Pulse 79   Temp 98.6 F (37 C) (Oral)   Ht 5\' 7"  (1.702 m)   Wt 185 lb 8 oz (84.1 kg)   SpO2 99%   BMI 29.05 kg/m   Constitutional: -  VS reviewed -  Well developed, well nourished, appears stated age -  No apparent distress  Psychiatric: -  Oriented to person, place, and time -  Memory intact -  Affect and mood normal -  Fluent conversation, good eye contact -  Judgment and insight age appropriate  Eye: -  Conjunctivae clear, no discharge -  Pupils symmetric, round, reactive to light  ENMT: -  MMM    Pharynx moist, no exudate, no erythema  Neck: -  No gross swelling, no palpable masses -  Thyroid midline, not enlarged, mobile, no palpable masses  Cardiovascular: -  RRR -  No LE edema  Respiratory: -  Normal respiratory effort, no accessory muscle use, no retraction -  Breath  sounds equal, no wheezes, no ronchi, no crackles  Neurological:  -  CN II - XII grossly intact -DTRs are equal and symmetric throughout -  Sensation grossly intact to light touch, equal bilaterally  Musculoskeletal: -  No clubbing, no cyanosis -  Gait normal strength -No tenderness to palpation over the suboccipital triangle region or paraspinal cervical musculature -   5/5 strength throughout  Skin: -  No significant lesion on inspection -  Warm and dry to palpation   ASSESSMENT/PLAN: Essential hypertension  Chronic daily headache  Need for tetanus booster - Plan: Tdap vaccine greater than or equal to 7yo IM  Need for vaccination  against Streptococcus pneumoniae - Plan: Pneumococcal polysaccharide vaccine 23-valent greater than or equal to 2yo subcutaneous/IM  Continue enalapril at 10 mg daily, counseled on diet and exercise.  Opted for lifestyle changes prior to changing medication.  Check blood pressures at home.  Bring both readings and monitor to next appointment. Patient would like to see if controlling blood pressure helps with her headaches.  If no improvement, will consider starting propranolol. Patient should return in 1 mo. The patient voiced understanding and agreement to the plan.   [place], DO 07/07/18  11:09 AM

## 2018-07-07 NOTE — Progress Notes (Signed)
Pre visit review using our clinic review tool, if applicable. No additional management support is needed unless otherwise documented below in the visit note. 

## 2018-07-09 ENCOUNTER — Encounter: Payer: Self-pay | Admitting: Obstetrics & Gynecology

## 2018-07-28 ENCOUNTER — Encounter: Payer: Self-pay | Admitting: Obstetrics & Gynecology

## 2018-07-28 ENCOUNTER — Ambulatory Visit (INDEPENDENT_AMBULATORY_CARE_PROVIDER_SITE_OTHER): Payer: 59 | Admitting: Obstetrics & Gynecology

## 2018-07-28 VITALS — BP 142/93 | HR 74 | Ht 67.0 in | Wt 185.1 lb

## 2018-07-28 DIAGNOSIS — Z9889 Other specified postprocedural states: Secondary | ICD-10-CM

## 2018-07-28 NOTE — Progress Notes (Signed)
History:  47 y.o. G3P3 here today for her 6 week post op visit. Pt is s/p RATH with bilateral salpingectomy on 06/17/2018.  Pt reports that she is doing well and wants to return to full duty at work. She reports occ hot flushes 2-3 times at night only. She denies pain or bleeding.       The following portions of the patient's history were reviewed and updated as appropriate: allergies, current medications, past family history, past medical history, past social history, past surgical history and problem list.  Review of Systems:  Pertinent items are noted in HPI.    Objective:  Physical Exam Blood pressure (!) 142/93, pulse 74, height 5\' 7"  (1.702 m), weight 185 lb 1.3 oz (84 kg), last menstrual period 04/19/2018.  CONSTITUTIONAL: Well-developed, well-nourished female in no acute distress.  HENT:  Normocephalic, atraumatic EYES: Conjunctivae and EOM are normal. No scleral icterus.  NECK: Normal range of motion SKIN: Skin is warm and dry. No rash noted. Not diaphoretic.No pallor. [place]: Alert and oriented to person, place, and time. Normal coordination.  Abd: Soft, nontender and nondistended; port sites well healed.  Pelvic: Normal appearing external genitalia; normal appearing vaginal mucosa; cuff well healed. The uterus is surgically absent. Normal discharge.    Labs and Imaging No results found.  Assessment & Plan:  6 week post op check-pt doing well  Return to work; no restrictions.   F/u in 3 months or sooner prn  May return to sexual intercourse in 2 weeks  Keyasha Miah L. Harraway-Smith, M.D., Cherlynn June

## 2018-08-14 ENCOUNTER — Ambulatory Visit: Payer: 59 | Admitting: Family Medicine

## 2018-08-14 DIAGNOSIS — Z0289 Encounter for other administrative examinations: Secondary | ICD-10-CM

## 2018-08-26 ENCOUNTER — Encounter: Payer: Self-pay | Admitting: Family Medicine

## 2018-11-03 ENCOUNTER — Ambulatory Visit (INDEPENDENT_AMBULATORY_CARE_PROVIDER_SITE_OTHER): Payer: 59 | Admitting: Obstetrics & Gynecology

## 2018-11-03 ENCOUNTER — Encounter: Payer: Self-pay | Admitting: Obstetrics & Gynecology

## 2018-11-03 VITALS — BP 144/79 | HR 76 | Ht 67.0 in | Wt 195.1 lb

## 2018-11-03 DIAGNOSIS — N898 Other specified noninflammatory disorders of vagina: Secondary | ICD-10-CM | POA: Diagnosis not present

## 2018-11-03 DIAGNOSIS — Z113 Encounter for screening for infections with a predominantly sexual mode of transmission: Secondary | ICD-10-CM

## 2018-11-03 DIAGNOSIS — R102 Pelvic and perineal pain: Secondary | ICD-10-CM

## 2018-11-03 DIAGNOSIS — B9689 Other specified bacterial agents as the cause of diseases classified elsewhere: Secondary | ICD-10-CM | POA: Diagnosis not present

## 2018-11-03 DIAGNOSIS — N760 Acute vaginitis: Secondary | ICD-10-CM

## 2018-11-03 DIAGNOSIS — D500 Iron deficiency anemia secondary to blood loss (chronic): Secondary | ICD-10-CM | POA: Diagnosis not present

## 2018-11-03 NOTE — Progress Notes (Signed)
History:  47 y.o. G3P3 here today for cramping. Pt reports that she had this same cramping after surgery then it resolved She reports that the only thing that changed was that she is sexually active. She is worried about STIs.  She has no discharge. The pain is worse in the am and resolves after voiding. It occ occurs when she is working and is resolved with Motrin x 1.     The following portions of the patient's history were reviewed and updated as appropriate: allergies, current medications, past family history, past medical history, past social history, past surgical history and problem list.  Review of Systems:  Pertinent items are noted in HPI.    Objective:  Physical Exam Blood pressure (!) 144/79, pulse 76, height 5\' 7"  (1.702 m), weight 195 lb 1.3 oz (88.5 kg), last menstrual period 04/19/2018.  CONSTITUTIONAL: Well-developed, well-nourished female in no acute distress.  HENT:  Normocephalic, atraumatic EYES: Conjunctivae and EOM are normal. No scleral icterus.  NECK: Normal range of motion SKIN: Skin is warm and dry. No rash noted. Not diaphoretic.No pallor. [place]: Alert and oriented to person, place, and time. Normal coordination.  Lungs: CTA CV: RRR Abd: Soft, nontender and nondistended Pelvic: Normal appearing external genitalia;  No palpable masses, no uterine or adnexal tenderness.    Assessment & Plan:  Pelvic pain- possibly due to adhesions. Need to r/o adnexal masses.    Pelvic US   STI screen:  Cx sent   Serum STI screen done today  H/o Anemia due to chrinic blood loss  CBC today  Elevated BP   Pt off meds. REc f/u with primary care provider to discuss meds  Pt encouraged to take her meds  Total face-to-face time with patient was 20 min.  Greater than 50% was spent in counseling and coordination of care with the patient.   Johm Pfannenstiel L. Harraway-Smith, M.D., Cherlynn June

## 2018-11-04 LAB — CERVICOVAGINAL ANCILLARY ONLY
Bacterial vaginitis: POSITIVE — AB
CANDIDA VAGINITIS: NEGATIVE

## 2018-11-04 LAB — GC/CHLAMYDIA PROBE AMP (~~LOC~~) NOT AT ARMC
Chlamydia: NEGATIVE
Neisseria Gonorrhea: NEGATIVE

## 2018-11-06 ENCOUNTER — Ambulatory Visit (HOSPITAL_BASED_OUTPATIENT_CLINIC_OR_DEPARTMENT_OTHER): Payer: 59

## 2018-11-06 ENCOUNTER — Other Ambulatory Visit: Payer: Self-pay | Admitting: Obstetrics & Gynecology

## 2018-11-06 DIAGNOSIS — N760 Acute vaginitis: Principal | ICD-10-CM

## 2018-11-06 DIAGNOSIS — B9689 Other specified bacterial agents as the cause of diseases classified elsewhere: Secondary | ICD-10-CM

## 2018-11-06 MED ORDER — METRONIDAZOLE 500 MG PO TABS: 500.0000 mg | ORAL_TABLET | Freq: Two times a day (BID) | ORAL | 0 refills | Status: DC

## 2018-11-06 NOTE — Progress Notes (Signed)
Called patient - left message for her to return call to office.

## 2018-11-10 NOTE — Progress Notes (Signed)
Patient returned call to office and given results. Patient states she picked up the flagyl and will start taking it . Kathrene Alu RN

## 2018-12-19 ENCOUNTER — Ambulatory Visit: Payer: 59 | Admitting: Obstetrics & Gynecology

## 2018-12-19 DIAGNOSIS — Z09 Encounter for follow-up examination after completed treatment for conditions other than malignant neoplasm: Secondary | ICD-10-CM

## 2019-12-16 IMAGING — US US TRANSVAGINAL NON-OB
1 series · 13 of 25 positions shown · non-contrast
Comparison: Pelvic ultrasound 10/17/2016

CLINICAL DATA: Patient with history of uterine fibroids.



[Series 1: us transvaginal non-ob · 0.25mm/px · 13 of 128 slices shown]
[im 1/128]
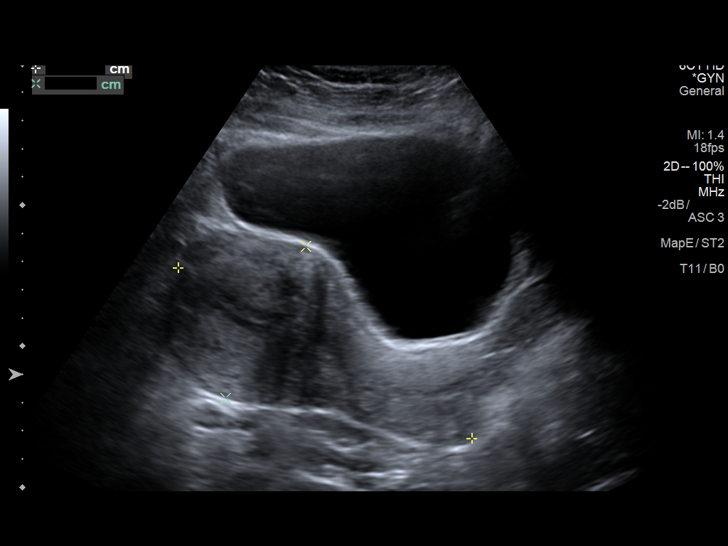
[im 11/128]
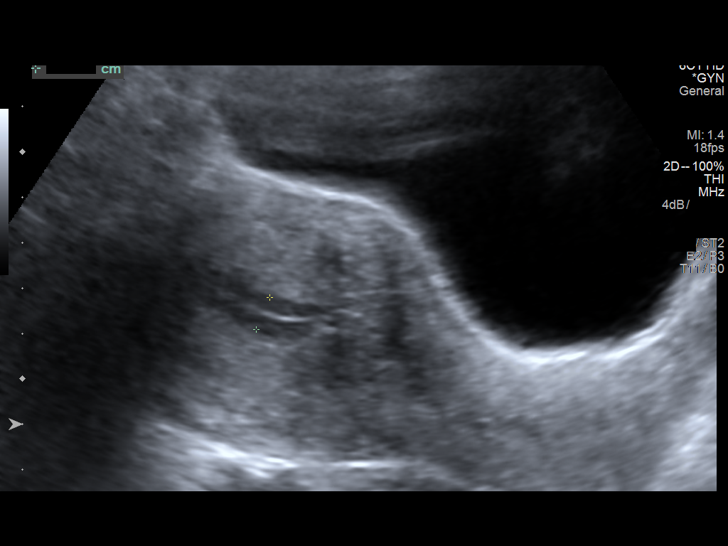
[im 22/128]
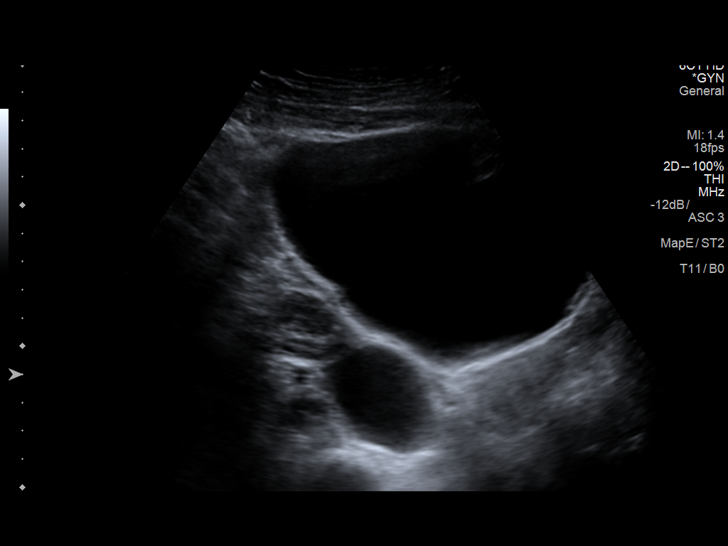
[im 32/128]
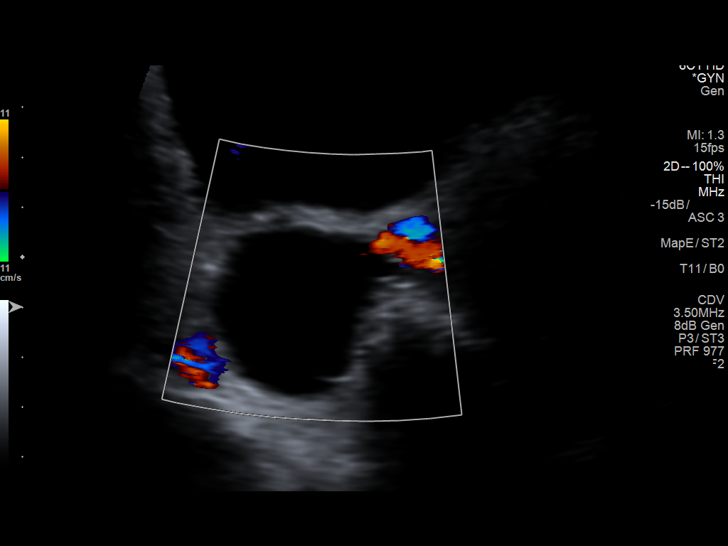
[im 43/128]
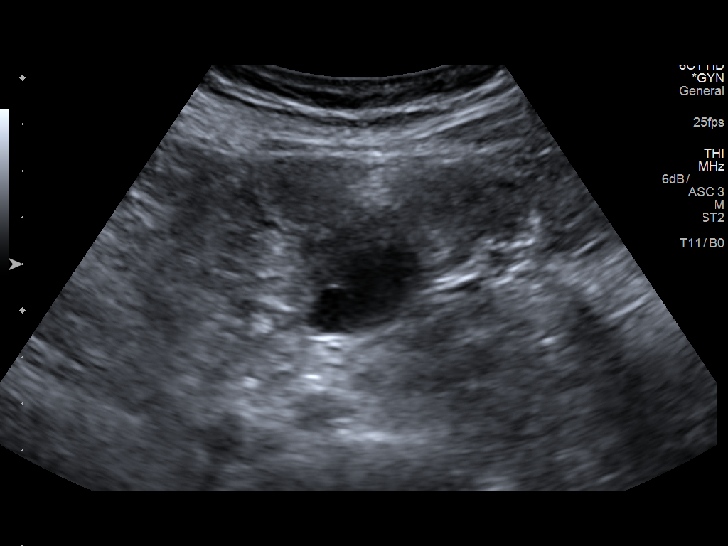
[im 53/128]
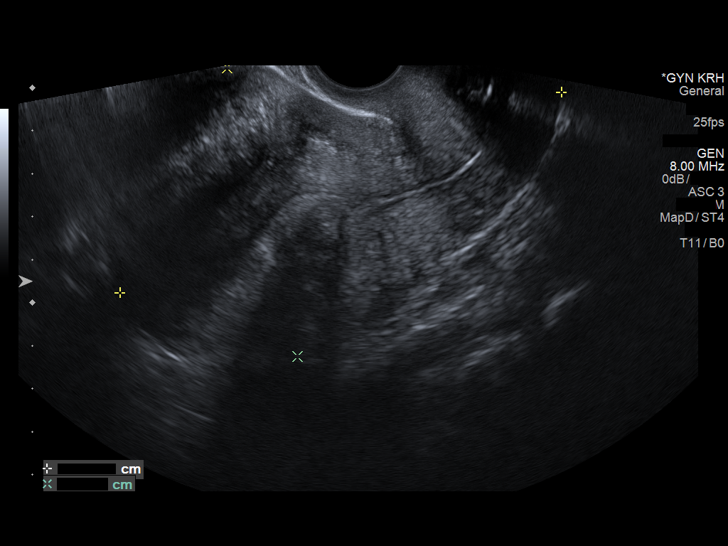
[im 64/128]
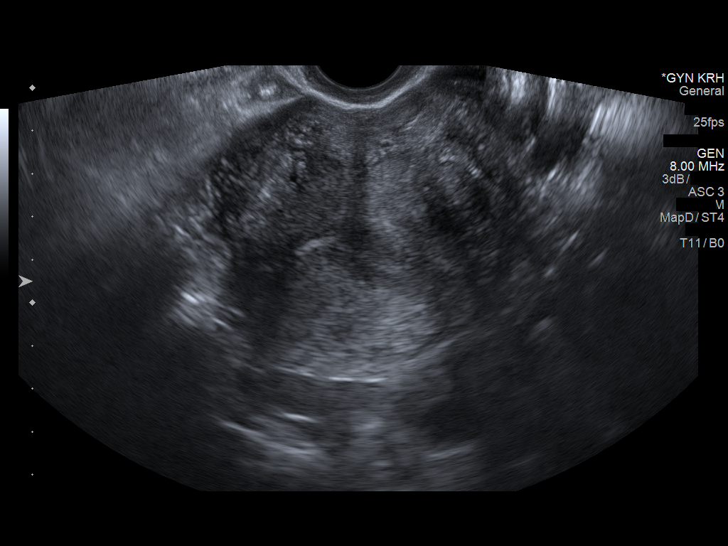
[im 75/128]
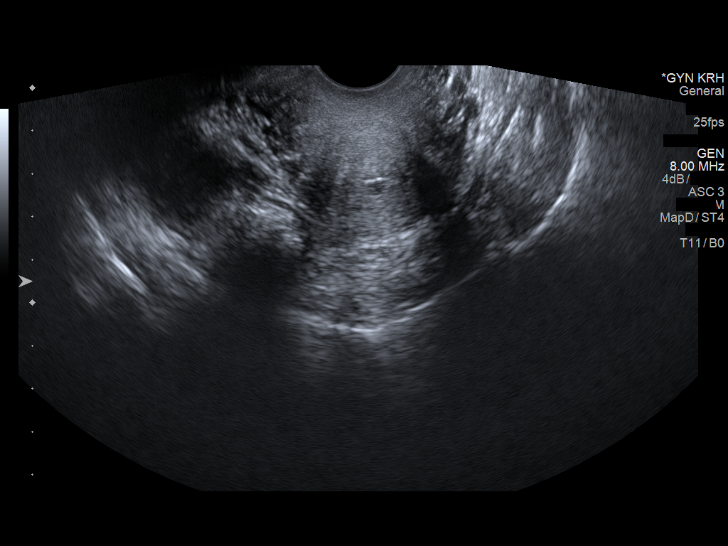
[im 85/128]
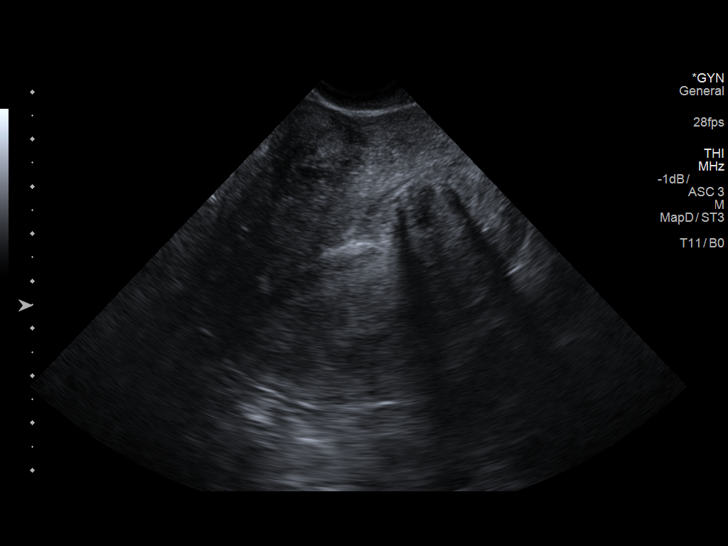
[im 96/128]
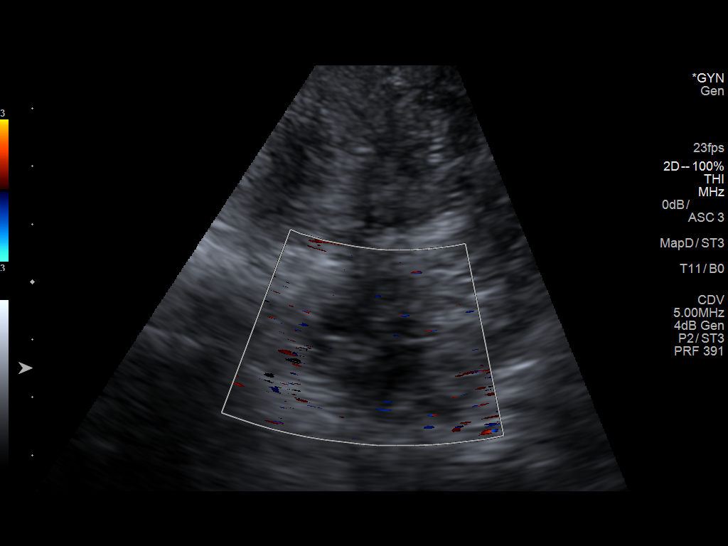
[im 106/128]
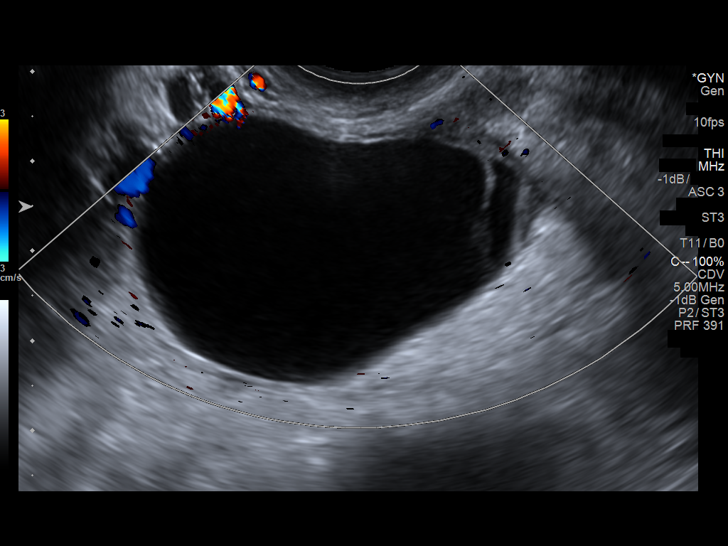
[im 117/128]
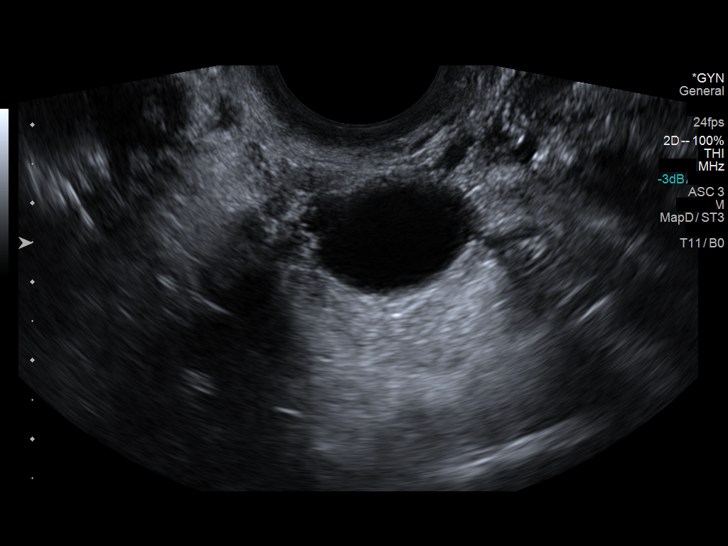
[im 128/128]
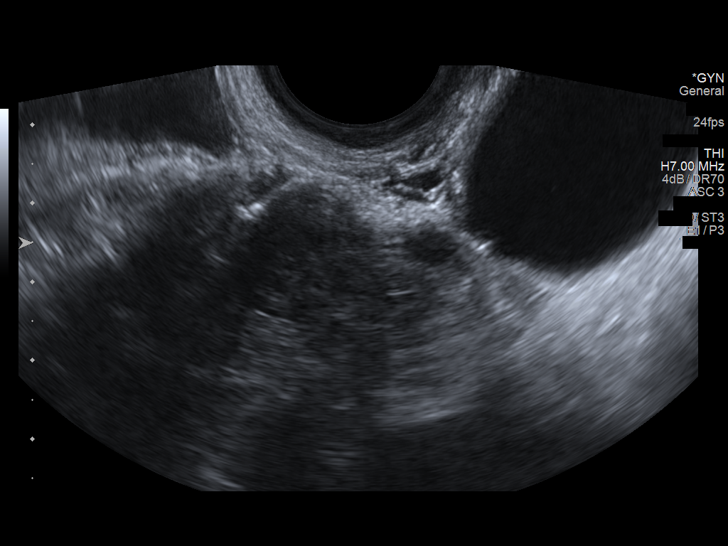

[13 of 25 positions shown; findings below may reference images not displayed]

FINDINGS: Uterus

Measurements: 12.0 x 6.1 x 7.0 cm. Multiple uterine fibroids are
redemonstrated. Unchanged intramural fibroid anterior uterine fundus
measuring 1.5 x 1.5 x 1.4 cm. Unchanged 3.8 x 3.5 x 3.2 cm fibroid
within the anterior uterine body. Unchanged 2.0 x 1.9 x 1.5 cm
fibroid within the posterior uterine body.

Endometrium

Thickness: 10 mm.  No focal abnormality visualized.

Right ovary

Measurements: 7.3 x 3.3 x 2.6 cm. Within the right adnexa there is a
4.1 x 2.8 x 3.4 cm cystic structure with internal septations.

Left ovary

Measurements: 3.2 x 1.8 x 2.7 cm. Normal appearance/no adnexal mass.

Other findings

Trace fluid in the pelvis.
IMPRESSION: 1. Multiple uterine fibroids as above, overall similar when compared
to prior exam.
2. Within the right adnexa there is a 4.1 cm complicated cystic
structure with internal septations, potentially representing an
ovarian cyst. Recommend follow-up pelvic ultrasound in 3 months to
assess for interval change/resolution. If this lesion persists,
surgical consultation may be warranted, depending upon sonographic
characteristics.

## 2020-01-18 ENCOUNTER — Other Ambulatory Visit: Payer: Self-pay

## 2020-01-18 ENCOUNTER — Telehealth: Payer: Self-pay

## 2020-01-18 DIAGNOSIS — I10 Essential (primary) hypertension: Secondary | ICD-10-CM

## 2020-01-18 MED ORDER — ENALAPRIL MALEATE 10 MG PO TABS: 10.0000 mg | ORAL_TABLET | Freq: Every day | ORAL | 0 refills | Status: DC

## 2020-01-18 NOTE — Telephone Encounter (Signed)
Pt called the office requesting a refill of enalapril.

## 2020-01-18 NOTE — Telephone Encounter (Signed)
Enalapril 10 mg 1 tab PO daily was sent to the pharmacy. Leith Szafranski l Edwyn Inclan, CMA

## 2020-01-19 ENCOUNTER — Other Ambulatory Visit: Payer: Self-pay

## 2020-01-19 ENCOUNTER — Encounter: Payer: Self-pay | Admitting: Family Medicine

## 2020-01-19 ENCOUNTER — Ambulatory Visit: Payer: 59 | Admitting: Family Medicine

## 2020-01-19 ENCOUNTER — Telehealth: Payer: Self-pay | Admitting: Emergency Medicine

## 2020-01-19 VITALS — BP 138/96 | HR 102 | Temp 96.1°F | Ht 67.5 in | Wt 186.0 lb

## 2020-01-19 DIAGNOSIS — I10 Essential (primary) hypertension: Secondary | ICD-10-CM | POA: Diagnosis not present

## 2020-01-19 DIAGNOSIS — R5383 Other fatigue: Secondary | ICD-10-CM

## 2020-01-19 DIAGNOSIS — H538 Other visual disturbances: Secondary | ICD-10-CM | POA: Diagnosis not present

## 2020-01-19 DIAGNOSIS — R3589 Other polyuria: Secondary | ICD-10-CM

## 2020-01-19 DIAGNOSIS — R358 Other polyuria: Secondary | ICD-10-CM | POA: Diagnosis not present

## 2020-01-19 LAB — HEMOGLOBIN A1C: Hgb A1c MFr Bld: 9.7 % — ABNORMAL HIGH (ref 4.6–6.5)

## 2020-01-19 LAB — COMPREHENSIVE METABOLIC PANEL
ALT: 15 U/L (ref 0–35)
AST: 14 U/L (ref 0–37)
Albumin: 4.6 g/dL (ref 3.5–5.2)
Alkaline Phosphatase: 105 U/L (ref 39–117)
BUN: 22 mg/dL (ref 6–23)
CO2: 26 mEq/L (ref 19–32)
Calcium: 9.8 mg/dL (ref 8.4–10.5)
Chloride: 90 mEq/L — ABNORMAL LOW (ref 96–112)
Creatinine, Ser: 1.17 mg/dL (ref 0.40–1.20)
GFR: 59.16 mL/min — ABNORMAL LOW (ref >60.00)
Glucose, Bld: 522 mg/dL (ref 70–99)
Potassium: 4.5 mEq/L (ref 3.5–5.1)
Sodium: 128 mEq/L — ABNORMAL LOW (ref 135–145)
Total Bilirubin: 0.6 mg/dL (ref 0.2–1.2)
Total Protein: 8 g/dL (ref 6.0–8.3)

## 2020-01-19 LAB — URINALYSIS, ROUTINE W REFLEX MICROSCOPIC
Bilirubin Urine: NEGATIVE
Ketones, ur: 40 — AB
Leukocytes,Ua: NEGATIVE
Nitrite: NEGATIVE
Specific Gravity, Urine: 1.015 (ref 1.000–1.030)
Urine Glucose: 500 — AB
Urobilinogen, UA: 0.2 (ref 0.0–1.0)
pH: 5.5 (ref 5.0–8.0)

## 2020-01-19 LAB — CBC
HCT: 44 % (ref 36.0–46.0)
Hemoglobin: 14.7 g/dL (ref 12.0–15.0)
MCHC: 33.3 g/dL (ref 30.0–36.0)
MCV: 93.7 fl (ref 78.0–100.0)
Platelets: 284 10*3/uL (ref 150.0–400.0)
RBC: 4.7 Mil/uL (ref 3.87–5.11)
RDW: 13.5 % (ref 11.5–15.5)
WBC: 5.3 10*3/uL (ref 4.0–10.5)

## 2020-01-19 LAB — TSH: TSH: 23.52 u[IU]/mL — ABNORMAL HIGH (ref 0.35–4.50)

## 2020-01-19 LAB — T4, FREE: Free T4: 0.81 ng/dL (ref 0.60–1.60)

## 2020-01-19 MED ORDER — METFORMIN HCL 500 MG PO TABS: ORAL_TABLET | ORAL | 1 refills | Status: DC

## 2020-01-19 NOTE — Telephone Encounter (Signed)
Called informed the patient of results/new medication sent in. Stressed to pickup and start asap. The patient agreed to pickup today and start. She did verbalize understanding/had no other questions or concerns.

## 2020-01-19 NOTE — Patient Instructions (Addendum)
Give us 2-3 business days to get the results of your labs back.   Keep the diet clean and stay active.  If you do not hear anything about your referral in the next 1-2 weeks, call our office and ask for an update.  Let us know if you need anything. 

## 2020-01-19 NOTE — Telephone Encounter (Signed)
"  CRITICAL VALUE STICKER  CRITICAL VALUE:522 GLUCOSE  RECEIVER (on-site recipient of call):Larisa Lanius  DATE & TIME NOTIFIED: 1310 01-19-20  MESSENGER (representative from lab):Clarey.Ates  MD NOTIFIED: Nani Ravens  TIME OF NOTIFICATION:1315  RESPONSE:

## 2020-01-19 NOTE — Telephone Encounter (Signed)
Plz let pt know her sugar is quite high. I called in a medicine to help with this until our follow up appointment. I tried calling her, no answer or option to leave VM. Ty.

## 2020-01-19 NOTE — Progress Notes (Signed)
Chief Complaint  Patient presents with  . Hypertension  . Urinary Frequency    every 2 hours///going on for 5 days  . Fatigue  . Blurred Vision  . Nausea    Subjective: Patient is a 49 y.o. female here for blurry vision.  She is here with her daughter.  Patient was last seen here over 18 months ago.  Over the past week, she has been having nausea, blurred vision, fatigue, urinary frequency, and elevated blood pressure.  She has not been taking her medicine recently.  Her GYN refilled her medication yesterday.  She has not started it yet.  She does not have any chest pain.  She continues to smoke and endorses more shortness of breath over the past week with exertion.  She does have a family history of diabetes in her mother who was diagnosed around age 59.  She is not having any pain with urination or bleeding.  She denies any fevers, wt loss, diarrhea, med changes.  ROS: Heart: Denies chest pain  Endo: No wt loss  Past Medical History:  Diagnosis Date  . Anemia   . 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 Headache   . Hypertension   . Thyroid disease    post operative    Objective: BP (!) 138/96 (BP Location: Right Arm, Patient Position: Sitting, Cuff Size: Normal)   Pulse (!) 102   Temp (!) 96.1 F (35.6 C) (Temporal)   Ht 5' 7.5" (1.715 m)   Wt 186 lb (84.4 kg)   LMP 04/19/2018 (Exact Date)   SpO2 98%   BMI 28.70 kg/m  General: Awake, appears stated age HEENT: MMM, EOMi Heart: RRR, no bruits or lower extremity edema GI: Abdomen is soft, nontender, nondistended, no masses or organomegaly Lungs: CTAB, no rales, wheezes or rhonchi. No accessory muscle use Psych: Age appropriate judgment and insight, normal affect and mood  Assessment and Plan: Polyuria - Plan: Hemoglobin A1c, Urinalysis, Urine Culture  Blurry vision - Plan: Ambulatory referral to Ophthalmology  Fatigue, unspecified type - Plan: Comprehensive metabolic panel, CBC, TSH, T4, free  Essential  hypertension  Check for infection though I doubt UTI.  Strongest concerns for diabetes.  I will refer her to the ophthalmology team regardless.  We will check other labs regarding her fatigue but again I believe this could be explained by diabetes.  Her blood pressure is not controlled today but she has been noncompliant with her medication.  I would like to see her in 2-3 weeks to recheck. The patient voiced understanding and agreement to the plan.  [place], DO 01/19/20  12:08 PM

## 2020-01-19 NOTE — Progress Notes (Signed)
u

## 2020-01-20 ENCOUNTER — Telehealth: Payer: Self-pay

## 2020-01-20 ENCOUNTER — Telehealth: Payer: Self-pay | Admitting: Family Medicine

## 2020-01-20 LAB — URINE CULTURE
MICRO NUMBER:: 10230714
SPECIMEN QUALITY:: ADEQUATE

## 2020-01-20 MED ORDER — ONETOUCH ULTRASOFT LANCETS MISC: 3 refills | Status: DC

## 2020-01-20 MED ORDER — ONETOUCH ULTRA 2 W/DEVICE KIT: PACK | 0 refills | Status: DC

## 2020-01-20 MED ORDER — ONETOUCH ULTRA VI STRP: ORAL_STRIP | 3 refills | Status: DC

## 2020-01-20 NOTE — Telephone Encounter (Signed)
Called the patient back and informed of results from yesterday 01/18/20. Glucose and hgba1c. The patient verbalized understanding.

## 2020-01-20 NOTE — Telephone Encounter (Signed)
CALLED INFORMED THE PATIENT OF pcp INSTRUCTIONs and sent in supplies.

## 2020-01-20 NOTE — Telephone Encounter (Signed)
After hours  call:   Caller states she needs to know her lab results.

## 2020-01-20 NOTE — Telephone Encounter (Signed)
Patient wants testing supplies for BS sent to her pharmacy. How often should she test?

## 2020-01-20 NOTE — Telephone Encounter (Signed)
Check your sugars around 2-3 times per week. Alternate checking in the morning before you eat, in the afternoon and before bed. Write them down and bring it to your next appointment.

## 2020-01-20 NOTE — Addendum Note (Signed)
Addended by: Sharon Seller B on: 01/20/2020 05:02 PM   Modules accepted: Orders

## 2020-01-25 ENCOUNTER — Telehealth: Payer: Self-pay

## 2020-01-25 ENCOUNTER — Other Ambulatory Visit: Payer: Self-pay

## 2020-01-25 ENCOUNTER — Encounter: Payer: Self-pay | Admitting: Family Medicine

## 2020-01-25 ENCOUNTER — Ambulatory Visit (INDEPENDENT_AMBULATORY_CARE_PROVIDER_SITE_OTHER): Payer: 59 | Admitting: Family Medicine

## 2020-01-25 DIAGNOSIS — R110 Nausea: Secondary | ICD-10-CM

## 2020-01-25 DIAGNOSIS — T887XXA Unspecified adverse effect of drug or medicament, initial encounter: Secondary | ICD-10-CM

## 2020-01-25 DIAGNOSIS — E1165 Type 2 diabetes mellitus with hyperglycemia: Secondary | ICD-10-CM | POA: Diagnosis not present

## 2020-01-25 MED ORDER — METFORMIN HCL ER 500 MG PO TB24: 500.0000 mg | ORAL_TABLET | Freq: Every day | ORAL | 2 refills | Status: DC

## 2020-01-25 MED ORDER — ONDANSETRON 4 MG PO TBDP: 4.0000 mg | ORAL_TABLET | Freq: Three times a day (TID) | ORAL | 0 refills | Status: DC | PRN

## 2020-01-25 NOTE — Progress Notes (Signed)
Chief Complaint  Patient presents with  . Hyperglycemia    lowest BS 390 and highest 520  . Medication Problem    metformin causing nausea//went to ER and is on insulin--[place]    Subjective: Patient is a 49 y.o. female here for elevated sugars. Due to COVID-19 pandemic, we are interacting via web portal for an electronic face-to-face visit. I verified patient's ID using 2 identifiers. Patient agreed to proceed with visit via this method. Patient is at home, I am at office. Patient, her daughter and I are present for visit.   A few days ago the patient was not feeling well with some nausea, abdominal pain, generalized weakness.  She went to the emergency department in [place] as she is vacationing down there.  She is on a very high sugar.  They told her to increase her Metformin to 1000 mg twice daily despite her gradual increase prescribed by our office.  She has felt much worse with nausea and poor energy since then.  She continues to urinate frequently.  Sugar this morning was in the low 400s.  She took 15 units of Levemir last night.  ROS: Heart: Denies chest pain  Lungs: Denies SOB   Past Medical History:  Diagnosis Date  . Anemia   . 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 Headache   . Hypertension   . Thyroid disease    post operative    Objective: LMP 04/19/2018 (Exact Date)  No conversational dyspnea Age appropriate judgment and insight Nml affect and mood  Assessment and Plan: Type 2 diabetes mellitus with hyperglycemia, without long-term current use of insulin (HCC) - Plan: metFORMIN (GLUCOPHAGE-XR) 500 MG 24 hr tablet  Nausea - Plan: ondansetron (ZOFRAN-ODT) 4 MG disintegrating tablet  Medication side effect  I think she is having a side effect of the Metformin.  I saw her 6 days ago and she had no symptoms similar to today.  Her sugar was in the 500s.  I doubt DKA as she was seen in the emergency department.  We will increase her Levemir up to 25 units at night.  I  would like her daughter to send me a message with tomorrow's sugar reading.  I will change her short acting Metformin to a long-acting version starting tomorrow.  Zofran as needed.  Continue to try to push fluids.  She needs to eat as well. The patient and her daughter voiced understanding and agreement to the plan.  [place], DO 01/25/20  12:02 PM

## 2020-01-27 ENCOUNTER — Telehealth: Payer: Self-pay | Admitting: Family Medicine

## 2020-01-27 NOTE — Telephone Encounter (Signed)
Patients daughter called today to inform the family went to [place] to [place]. The patient had to go to the ER her BS was 689. Was put in ICU/doing better now and BS this am 173. States should be moved to another room today and discharged tomorrow. The daughter did schedule OV with PCP on Friday 01/29/2020 for Hosp. Followup. The daughter did get our fax number to have [facility] hospital records faxed to PCP.

## 2020-01-29 ENCOUNTER — Encounter: Payer: Self-pay | Admitting: Family Medicine

## 2020-01-29 ENCOUNTER — Inpatient Hospital Stay: Payer: 59 | Admitting: Family Medicine

## 2020-01-29 ENCOUNTER — Ambulatory Visit (INDEPENDENT_AMBULATORY_CARE_PROVIDER_SITE_OTHER): Payer: 59 | Admitting: Family Medicine

## 2020-01-29 ENCOUNTER — Other Ambulatory Visit: Payer: Self-pay

## 2020-01-29 VITALS — BP 140/80 | HR 79 | Temp 96.4°F | Ht 67.0 in | Wt 185.5 lb

## 2020-01-29 DIAGNOSIS — E1110 Type 2 diabetes mellitus with ketoacidosis without coma: Secondary | ICD-10-CM | POA: Diagnosis not present

## 2020-01-29 MED ORDER — ATORVASTATIN CALCIUM 40 MG PO TABS: 40.0000 mg | ORAL_TABLET | Freq: Every day | ORAL | 2 refills | Status: DC

## 2020-01-29 MED ORDER — LANTUS SOLOSTAR 100 UNIT/ML ~~LOC~~ SOPN: 27.0000 [IU] | PEN_INJECTOR | Freq: Every day | SUBCUTANEOUS | Status: DC

## 2020-01-29 MED ORDER — LOSARTAN POTASSIUM 50 MG PO TABS: 50.0000 mg | ORAL_TABLET | Freq: Every day | ORAL | 2 refills | Status: DC

## 2020-01-29 NOTE — Patient Instructions (Addendum)
Give [place] 2-3 business days to get the results of your labs back.   Keep the diet clean and stay active.  Continue to monitor your sugars.   Bring them to your next appointment.  Let us know if you need anything.  Healthy Eating Plan Many factors influence your heart health, including eating and exercise habits. Heart (coronary) risk increases with abnormal blood fat (lipid) levels. Heart-healthy meal planning includes limiting unhealthy fats, increasing healthy fats, and making other small dietary changes. This includes maintaining a healthy body weight to help keep lipid levels within a normal range.  WHAT IS MY PLAN?  Your health care provider recommends that you:  Drink a glass of water before meals to help with satiety.  Eat slowly.  An alternative to the water is to add Metamucil. This will help with satiety as well. It does contain calories, unlike water.  WHAT TYPES OF FAT SHOULD I CHOOSE?  Choose healthy fats more often. Choose monounsaturated and polyunsaturated fats, such as olive oil and canola oil, flaxseeds, walnuts, almonds, and seeds.  Eat more omega-3 fats. Good choices include salmon, mackerel, sardines, tuna, flaxseed oil, and ground flaxseeds. Aim to eat fish at least two times each week.  Avoid foods with partially hydrogenated oils in them. These contain trans fats. Examples of foods that contain trans fats are stick margarine, some tub margarines, cookies, crackers, and other baked goods. If you are going to avoid a fat, this is the one to avoid!  WHAT GENERAL GUIDELINES DO I NEED TO FOLLOW?  Check food labels carefully to identify foods with trans fats. Avoid these types of options when possible.  Fill one half of your plate with vegetables and green salads. Eat 4-5 servings of vegetables per day. A serving of vegetables equals 1 cup of raw leafy vegetables,  cup of raw or cooked cut-up vegetables, or  cup of vegetable juice.  Fill one fourth of your plate  with whole grains. Look for the word "whole" as the first word in the ingredient list.  Fill one fourth of your plate with lean protein foods.  Eat 4-5 servings of fruit per day. A serving of fruit equals one medium whole fruit,  cup of dried fruit,  cup of fresh, frozen, or canned fruit. Try to avoid fruits in cups/syrups as the sugar content can be high.  Eat more foods that contain soluble fiber. Examples of foods that contain this type of fiber are apples, broccoli, carrots, beans, peas, and barley. Aim to get 20-30 g of fiber per day.  Eat more home-cooked food and less restaurant, buffet, and fast food.  Limit or avoid alcohol.  Limit foods that are high in starch and sugar.  Avoid fried foods when able.  Cook foods by using methods other than frying. Baking, boiling, grilling, and broiling are all great options. Other fat-reducing suggestions include: ? Removing the skin from poultry. ? Removing all visible fats from meats. ? Skimming the fat off of stews, soups, and gravies before serving them. ? Steaming vegetables in water or broth.  Lose weight if you are overweight. Losing just 5-10% of your initial body weight can help your overall health and prevent diseases such as diabetes and heart disease.  Increase your consumption of nuts, legumes, and seeds to 4-5 servings per week. One serving of dried beans or legumes equals  cup after being cooked, one serving of nuts equals 1 ounces, and one serving of seeds equals  ounce or 1  tablespoon.  WHAT ARE GOOD FOODS CAN I EAT? Grains Grainy breads (try to find bread that is 3 g of fiber per slice or greater), oatmeal, light popcorn. Whole-grain cereals. Rice and pasta, including brown rice and those that are made with whole wheat. Edamame pasta is a great alternative to grain pasta. It has a higher protein content. Try to avoid significant consumption of white bread, sugary cereals, or pastries/baked goods.  Vegetables All  vegetables. Cooked white potatoes do not count as vegetables.  Fruits All fruits, but limit pineapple and bananas as these fruits have a higher sugar content.  Meats and Other Protein Sources Lean, well-trimmed beef, veal, pork, and lamb. Chicken and [place] without skin. All fish and shellfish. Wild duck, rabbit, pheasant, and venison. Egg whites or low-cholesterol egg substitutes. Dried beans, peas, lentils, and tofu.Seeds and most nuts.  Dairy Low-fat or nonfat cheeses, including ricotta, string, and mozzarella. Skim or 1% milk that is liquid, powdered, or evaporated. Buttermilk that is made with low-fat milk. Nonfat or low-fat yogurt. Soy/Almond milk are good alternatives if you cannot handle dairy.  Beverages Water is the best for you. Sports drinks with less sugar are more desirable unless you are a highly active athlete.  Sweets and Desserts Sherbets and fruit ices. Honey, jam, marmalade, jelly, and syrups. Dark chocolate.  Eat all sweets and desserts in moderation.  Fats and Oils Nonhydrogenated (trans-free) margarines. Vegetable oils, including soybean, sesame, sunflower, olive, peanut, safflower, corn, canola, and cottonseed. Salad dressings or mayonnaise that are made with a vegetable oil. Limit added fats and oils that you use for cooking, baking, salads, and as spreads.  Other Cocoa powder. Coffee and tea. Most condiments.  The items listed above may not be a complete list of recommended foods or beverages. Contact your dietitian for more options.

## 2020-01-29 NOTE — Progress Notes (Signed)
Chief Complaint  Patient presents with  . Hospitalization Follow-up    HPI Jenna Nichols is a 49 y.o. y.o. female who presents for a transition of care visit.  Pt was discharged from an outside [facility] in [facility] on 01/21/20.  She is presenting to our office w/in 48 business hrs of d/c.  She is here with her daughter who helps provide the history.  Pt recently dx'd w DM after being lost to f/u for over a year at this office. Sugars had been quite high prior to hospitalization, running in the 400s. She was started on metformin and recommended to check her sugars. She went to [facility] and went to ED and was admitted for DKA. She received fluids and insulin. She is currently on Lantus 25 u nightly in addition to metformin 500 mg extended release daily.  She is tolerating these well.  Sugars are down to the low 200s.  She feels fine at this level.  She is not having any lows.  She is compliant with her medication regimen.  Diet is improving though she is apprehensive about certain foods causing a spike in sugars.  She is trying to stay active.  She has several questions about medications prescribed.   Past Medical History:  Diagnosis Date  . Anemia   . 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 Headache   . Hypertension   . Thyroid disease    post operative   Past Surgical History:  Procedure Laterality Date  . CYSTOSCOPY N/A 06/17/2018   Procedure: CYSTOSCOPY;  Surgeon: Lavonia Drafts, MD;  Location: [facility];  Service: Gynecology;  Laterality: N/A;  . ROBOTIC ASSISTED TOTAL HYSTERECTOMY WITH BILATERAL SALPINGO OOPHERECTOMY Bilateral 06/17/2018   Procedure: XI ROBOTIC ASSISTED TOTAL HYSTERECTOMY WITH BILATERAL SALPINGECTOMY AND RIGHT OOPHORECTOMY;  Surgeon: Lavonia Drafts, MD;  Location: [facility];  Service: Gynecology;  Laterality: Bilateral;  . THYROIDECTOMY    . TUBAL LIGATION     Family History  Problem Relation Age of Onset  . Diabetes Mother   . COPD Mother   . Arthritis Mother   . Cancer  Brother    Allergies as of 01/29/2020   No Known Allergies     Medication List       Accurate as of January 29, 2020 11:59 PM. If you have any questions, ask your nurse or doctor.        STOP taking these medications   enalapril 10 MG tablet Commonly known as: Vasotec Stopped by: Shelda Pal, DO     TAKE these medications   aspirin EC 81 MG tablet Take 81 mg by mouth daily.   atorvastatin 40 MG tablet Commonly known as: LIPITOR Take 1 tablet (40 mg total) by mouth daily.   Lantus SoloStar 100 UNIT/ML Solostar Pen Generic drug: insulin glargine Inject 27 Units into the skin daily. What changed: how much to take Changed by: Shelda Pal, DO   losartan 50 MG tablet Commonly known as: COZAAR Take 1 tablet (50 mg total) by mouth daily. What changed:   medication strength  how much to take  when to take this Changed by: Shelda Pal, DO   metFORMIN 500 MG 24 hr tablet Commonly known as: GLUCOPHAGE-XR Take 1 tablet (500 mg total) by mouth daily with breakfast.   ondansetron 4 MG disintegrating tablet Commonly known as: ZOFRAN-ODT Take 1 tablet (4 mg total) by mouth every 8 (eight) hours as needed for nausea or vomiting.   ONE  TOUCH ULTRA 2 w/Device Kit Use once daily to check blood sugar.  DX E11.9   OneTouch Ultra test strip Generic drug: glucose blood Use once daily to check blood sugar. DX E11.9   onetouch ultrasoft lancets Use once daily to check blood sugar. DX E11.9   Synthroid 137 MCG tablet Generic drug: levothyroxine Take 137 mcg by mouth daily before breakfast.       ROS:  Constitutional: No fevers or chills, no weight loss HEENT: No headaches, hearing loss, or runny nose, no sore throat Heart: No chest pain Lungs: No SOB, no cough Abd: No bowel changes, no pain, no N/V GU: No urinary complaints Neuro: No numbness, tingling or weakness Msk: No joint or muscle pain  Objective BP 140/80 (BP Location: Left  Arm, Patient Position: Sitting, Cuff Size: Normal)   Pulse 79   Temp (!) 96.4 F (35.8 C) (Temporal)   Ht _0  (1.702 m)   Wt 185 lb 8 oz (84.1 kg)   LMP 04/19/2018 (Exact Date)   SpO2 95%   BMI 29.05 kg/m  General Appearance:  awake, alert, oriented, in no acute distress and well developed, well nourished Skin:  there are no suspicious lesions or rashes of concern Head/face:  NCAT Eyes:  EOMI, PERRLA Ears:  canals and TMs NI Nose/Sinuses:  negative Mouth/Throat:  Mucosa moist, no lesions; pharynx without erythema, edema or exudate. Neck:  neck- supple, no mass, non-tender and no jvd Lungs: Clear to auscultation.  No rales, rhonchi, or wheezing. Normal effort, no accessory muscle use. Heart:  Heart sounds are normal.  Regular rate and rhythm without murmur, gallop or rub. No bruits. Abdomen:  BS+, soft, NT, ND, no masses or organomegaly Musculoskeletal:  No muscle group atrophy or asymmetry, gait normal Neurologic:  Alert and oriented x 3, gait normal., reflexes normal and symmetric, strength and  sensation grossly normal Psych exam: Nml mood and affect, age appropriate judgment and insight  Diabetic ketoacidosis without coma associated with type 2 diabetes mellitus [facility])  Discharge summary and medication list have been reviewed/reconciled.  She does not need aspirin as there is no strong indication for primary prevention.  I would like her on a statin so she should continue the Lipitor.  They switched her from enalapril to losartan.  I am okay with this change.  She will start taking consistently and we will keep an eye on her blood pressure.  We will increase the dose of her Lantus from 25 units nightly to 27 units nightly.  They prescribed a short acting Metformin at the hospital which I will discontinue as she is already been prescribed extended release Metformin.  She does not need a new glucometer at this time.  We will likely increase this in the future.  No follow-up labs or  appointments required at this time.  TRANSITIONAL CARE MANAGEMENT CERTIFICATION:  I certify the following are true:   1. Communication with the patient/care giver was made within 2 business days of discharge.  2. Complexity of Medical decision making is high.  3. Face to face visit occurred within 7 days of discharge.   F/u as originally scheduled next week. The patient and her daughter voiced understanding and agreement to the plan.  [place], DO 01/30/20 6:15 AM

## 2020-01-30 ENCOUNTER — Encounter: Payer: Self-pay | Admitting: Family Medicine

## 2020-02-02 ENCOUNTER — Other Ambulatory Visit: Payer: Self-pay

## 2020-02-03 ENCOUNTER — Encounter: Payer: Self-pay | Admitting: Family Medicine

## 2020-02-03 ENCOUNTER — Ambulatory Visit (INDEPENDENT_AMBULATORY_CARE_PROVIDER_SITE_OTHER): Payer: 59 | Admitting: Family Medicine

## 2020-02-03 ENCOUNTER — Other Ambulatory Visit: Payer: Self-pay

## 2020-02-03 VITALS — BP 138/80 | HR 93 | Temp 96.1°F | Ht 67.0 in | Wt 182.5 lb

## 2020-02-03 DIAGNOSIS — Z1239 Encounter for other screening for malignant neoplasm of breast: Secondary | ICD-10-CM

## 2020-02-03 DIAGNOSIS — Z0000 Encounter for general adult medical examination without abnormal findings: Secondary | ICD-10-CM

## 2020-02-03 DIAGNOSIS — Z1211 Encounter for screening for malignant neoplasm of colon: Secondary | ICD-10-CM | POA: Diagnosis not present

## 2020-02-03 LAB — COMPREHENSIVE METABOLIC PANEL
ALT: 23 U/L (ref 0–35)
AST: 20 U/L (ref 0–37)
Albumin: 4 g/dL (ref 3.5–5.2)
Alkaline Phosphatase: 65 U/L (ref 39–117)
BUN: 10 mg/dL (ref 6–23)
CO2: 30 mEq/L (ref 19–32)
Calcium: 9.1 mg/dL (ref 8.4–10.5)
Chloride: 101 mEq/L (ref 96–112)
Creatinine, Ser: 0.75 mg/dL (ref 0.40–1.20)
GFR: 98.82 mL/min (ref >60.00)
Glucose, Bld: 154 mg/dL — ABNORMAL HIGH (ref 70–99)
Potassium: 4.6 mEq/L (ref 3.5–5.1)
Sodium: 138 mEq/L (ref 135–145)
Total Bilirubin: 0.6 mg/dL (ref 0.2–1.2)
Total Protein: 6.9 g/dL (ref 6.0–8.3)

## 2020-02-03 LAB — LIPID PANEL
Cholesterol: 154 mg/dL (ref 0–200)
HDL: 57.1 mg/dL (ref >39.00)
LDL Cholesterol: 85 mg/dL (ref 0–99)
NonHDL: 96.96
Total CHOL/HDL Ratio: 3
Triglycerides: 61 mg/dL (ref 0.0–149.0)
VLDL: 12.2 mg/dL (ref 0.0–40.0)

## 2020-02-03 NOTE — Patient Instructions (Addendum)
[facility]--Chanda Griessel--[phone]  Give [place] 2-3 business days to get the results of your labs back.   Keep the diet clean and stay active.  Fasting sugars <140-150 and random sugars less than 200 are our goal.   The new Shingrix vaccine (for shingles) is a 2 shot series. It can make people feel low energy, achy and almost like they have the flu for 48 hours after injection. Please plan accordingly when deciding on when to get this shot. Call our office for a nurse visit appointment to get this. The second shot of the series is less severe regarding the side effects, but it still lasts 48 hours.   Let us know if you need anything.

## 2020-02-03 NOTE — Progress Notes (Signed)
Chief Complaint  Patient presents with  . Annual Exam     Well Woman Jenna Nichols is here for a complete physical.   Her last physical was >1 year ago.  Current diet: in general, a "healthy" diet. Current exercise: walking. Weight is stable and she denies daytime fatigue. Patient's last menstrual period was 04/19/2018 (exact date). Seatbelt? Yes  Health Maintenance Mammogram- No Colon cancer screening-No Shingrix- No Tetanus- Yes HIV screening- No  Past Medical History:  Diagnosis Date  . Anemia   . 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 Headache   . Hypertension   . Thyroid disease    post operative     Past Surgical History:  Procedure Laterality Date  . CYSTOSCOPY N/A 06/17/2018   Procedure: CYSTOSCOPY;  Surgeon: Lavonia Drafts, MD;  Location: [facility];  Service: Gynecology;  Laterality: N/A;  . ROBOTIC ASSISTED TOTAL HYSTERECTOMY WITH BILATERAL SALPINGO OOPHERECTOMY Bilateral 06/17/2018   Procedure: XI ROBOTIC ASSISTED TOTAL HYSTERECTOMY WITH BILATERAL SALPINGECTOMY AND RIGHT OOPHORECTOMY;  Surgeon: Lavonia Drafts, MD;  Location: [facility];  Service: Gynecology;  Laterality: Bilateral;  . THYROIDECTOMY    . TUBAL LIGATION      Medications  Current Outpatient Medications on File Prior to Visit  Medication Sig Dispense Refill  . aspirin EC 81 MG tablet Take 81 mg by mouth daily.    Marland Kitchen atorvastatin (LIPITOR) 40 MG tablet Take 1 tablet (40 mg total) by mouth daily. 90 tablet 2  . Blood Glucose Monitoring Suppl (ONE TOUCH ULTRA 2) w/Device KIT Use once daily to check blood sugar.  DX E11.9 1 kit 0  . glucose blood (ONETOUCH ULTRA) test strip Use once daily to check blood sugar. DX E11.9 100 each 3  . insulin glargine (LANTUS SOLOSTAR) 100 UNIT/ML Solostar Pen Inject 27 Units into the skin daily. 15 mL   . Lancets (ONETOUCH ULTRASOFT) lancets Use once daily to check blood sugar. DX E11.9 100 each 3  . levothyroxine (SYNTHROID) 137 MCG tablet Take 137 mcg by mouth  daily before breakfast.     . losartan (COZAAR) 50 MG tablet Take 1 tablet (50 mg total) by mouth daily. 90 tablet 2  . metFORMIN (GLUCOPHAGE-XR) 500 MG 24 hr tablet Take 1 tablet (500 mg total) by mouth daily with breakfast. 30 tablet 2  . ondansetron (ZOFRAN-ODT) 4 MG disintegrating tablet Take 1 tablet (4 mg total) by mouth every 8 (eight) hours as needed for nausea or vomiting. 20 tablet 0   Allergies No Known Allergies  Review of Systems: Constitutional:  no unexpected weight changes Eye:  no recent significant change in vision Ear/Nose/Mouth/Throat:  Ears:  no recent change in hearing Nose/Mouth/Throat:  no complaints of nasal congestion, no sore throat Cardiovascular: no chest pain Respiratory:  no shortness of breath Gastrointestinal:  no abdominal pain, no change in bowel habits GU:  Female: negative for dysuria or pelvic pain Musculoskeletal/Extremities:  no pain of the joints Integumentary (Skin/Breast):  no abnormal skin lesions reported Neurologic:  no headaches Endocrine:  denies fatigue Hematologic/Lymphatic:  No areas of easy bleeding  Exam BP 138/80 (BP Location: Left Arm, Patient Position: Sitting, Cuff Size: Normal)   Pulse 93   Temp (!) 96.1 F (35.6 C) (Temporal)   Ht '5\' 7"'  (1.702 m)   Wt 182 lb 8 oz (82.8 kg)   LMP 04/19/2018 (Exact Date)   SpO2 97%   BMI 28.58 kg/m  General:  well developed, well nourished, in no apparent distress Skin:  no significant moles, warts, or growths Head:  no masses, lesions, or tenderness Eyes:  pupils equal and round, sclera anicteric without injection Ears:  canals without lesions, TMs shiny without retraction, no obvious effusion, no erythema Nose:  nares patent, septum midline, mucosa normal, and no drainage or sinus tenderness Throat/Pharynx:  lips and gingiva without lesion; tongue and uvula midline; non-inflamed pharynx; no exudates or postnasal drainage Neck: neck supple without adenopathy, thyromegaly, or  masses Lungs:  clear to auscultation, breath sounds equal bilaterally, no respiratory distress Cardio:  regular rate and rhythm, no LE edema Abdomen:  abdomen soft, nontender; bowel sounds normal; no masses or organomegaly Genital: Defer to GYN Musculoskeletal:  symmetrical muscle groups noted without atrophy or deformity Extremities:  no clubbing, cyanosis, or edema, no deformities, no skin discoloration Neuro:  gait normal; deep tendon reflexes normal and symmetric Psych: well oriented with normal range of affect and appropriate judgment/insight  Assessment and Plan  Well adult exam - Plan: Lipid panel, Comprehensive metabolic panel  Screening breast examination - Plan: MM DIGITAL SCREENING BILATERAL  Screen for colon cancer - Plan: Ambulatory referral to Gastroenterology   Well 49 y.o. female. Counseled on diet and exercise. Other orders as above. Ophtho info given.  CCS to be set up. Shingrix info given. Mammogram.  Does not have cervix.  Follow up in 3 mo for DM visit. The patient voiced understanding and agreement to the plan.  [place], DO 02/03/20 1:39 PM

## 2020-02-05 ENCOUNTER — Telehealth: Payer: Self-pay | Admitting: Family Medicine

## 2020-02-05 ENCOUNTER — Other Ambulatory Visit: Payer: Self-pay

## 2020-02-05 ENCOUNTER — Encounter (HOSPITAL_BASED_OUTPATIENT_CLINIC_OR_DEPARTMENT_OTHER): Payer: Self-pay

## 2020-02-05 ENCOUNTER — Ambulatory Visit (HOSPITAL_BASED_OUTPATIENT_CLINIC_OR_DEPARTMENT_OTHER)
Admission: RE | Admit: 2020-02-05 | Discharge: 2020-02-05 | Disposition: A | Discharge status: Home / Self Care | Payer: 59 | Source: Ambulatory Visit | Attending: Family Medicine | Admitting: Family Medicine

## 2020-02-05 DIAGNOSIS — Z1231 Encounter for screening mammogram for malignant neoplasm of breast: Secondary | ICD-10-CM | POA: Insufficient documentation

## 2020-02-05 DIAGNOSIS — Z1239 Encounter for other screening for malignant neoplasm of breast: Secondary | ICD-10-CM

## 2020-02-05 NOTE — Telephone Encounter (Signed)
PCP completed FMLA paperwork for the patient. Faxed to number on paperwork [phone]. Received fax confirmation and patient informed done/faxed. She does not want a copy of paperwork. Labeled and sent to scan

## 2020-02-11 ENCOUNTER — Encounter: Payer: Self-pay | Admitting: Family Medicine

## 2020-02-11 LAB — HM DIABETES EYE EXAM

## 2020-03-22 ENCOUNTER — Telehealth: Payer: Self-pay | Admitting: Family Medicine

## 2020-03-22 MED ORDER — LANTUS SOLOSTAR 100 UNIT/ML ~~LOC~~ SOPN: 27.0000 [IU] | PEN_INJECTOR | Freq: Every day | SUBCUTANEOUS | 3 refills | Status: DC

## 2020-03-22 NOTE — Telephone Encounter (Signed)
Refill done.  

## 2020-03-22 NOTE — Telephone Encounter (Signed)
Medication: insulin glargine (LANTUS SOLOSTAR) 100 UNIT/ML solostar pen   Has the patient contacted their pharmacy? Yes.   (If no, request that the patient contact the pharmacy for the refill.) (If yes, when and what did the pharmacy advise?)  Preferred Pharmacy (with phone number or street name):  [place], [place] - [address] [place] [address]  Phone:  [phone] Fax:  [phone]   Agent: Please be advised that RX refills may take up to 3 business days. We ask that you follow-up with your pharmacy.

## 2020-04-07 ENCOUNTER — Encounter: Payer: Self-pay | Admitting: Family Medicine

## 2020-05-04 ENCOUNTER — Ambulatory Visit: Payer: 59 | Admitting: Family Medicine

## 2020-05-06 ENCOUNTER — Encounter: Payer: Self-pay | Admitting: Family Medicine

## 2020-05-06 ENCOUNTER — Other Ambulatory Visit: Payer: Self-pay

## 2020-05-06 ENCOUNTER — Ambulatory Visit: Payer: 59 | Admitting: Family Medicine

## 2020-05-06 VITALS — BP 136/86 | HR 84 | Temp 95.9°F | Resp 18 | Ht 67.0 in | Wt 188.0 lb

## 2020-05-06 DIAGNOSIS — E1165 Type 2 diabetes mellitus with hyperglycemia: Secondary | ICD-10-CM

## 2020-05-06 DIAGNOSIS — Z1159 Encounter for screening for other viral diseases: Secondary | ICD-10-CM | POA: Diagnosis not present

## 2020-05-06 DIAGNOSIS — Z794 Long term (current) use of insulin: Secondary | ICD-10-CM

## 2020-05-06 MED ORDER — LOSARTAN POTASSIUM 50 MG PO TABS: 50.0000 mg | ORAL_TABLET | Freq: Every day | ORAL | 2 refills | Status: DC

## 2020-05-06 MED ORDER — ATORVASTATIN CALCIUM 40 MG PO TABS: 40.0000 mg | ORAL_TABLET | Freq: Every day | ORAL | 2 refills | Status: DC

## 2020-05-06 NOTE — Progress Notes (Signed)
Subjective:   Chief Complaint  Patient presents with  . Diabetes    No new concerns.    Jenna Nichols is a 49 y.o. female here for follow-up of diabetes.   Jenna Nichols's self monitored glucose range is 70-120's.  Patient denies hypoglycemic reactions. She checks her glucose levels 1x per day. Patient does require insulin.  Lantus 27 u qhs.  Medications include: Metformin XR 500 mg/d Diet is fair. Exercise: active at work  Past Medical History:  Diagnosis Date  . Anemia   . 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 Headache   . Hypertension   . Thyroid disease    post operative     Related testing: Date of retinal exam:Done Pneumovax: done  Objective:  BP 136/86   Pulse 84   Temp (!) 95.9 F (35.5 C) (Temporal)   Resp 18   Ht 5\' 7"  (1.702 m)   Wt 188 lb (85.3 kg)   LMP 04/19/2018 (Exact Date)   SpO2 100%   BMI 29.44 kg/m  General:  Well developed, well nourished, in no apparent distress Lungs:  CTAB, no access msc use Cardio:  RRR, no bruits, no LE edema Psych: Age appropriate judgment and insight  Assessment:   Type 2 diabetes mellitus with hyperglycemia, with long-term current use of insulin (HCC) - Plan: Hemoglobin A1c  Encounter for hepatitis C screening test for low risk patient   Plan:   Counseled on diet and exercise. Goal is to wean her from Lantus. Will see what A1c is. Hopefully her ins will cover SGLT-2 inhibitors and GLP-1's. She is willing to take a weekly shot, would consider Ozempic vs Trulicity.  F/u in 3 mo. The patient voiced understanding and agreement to the plan.  [place], DO 05/06/20 3:20 PM

## 2020-05-06 NOTE — Patient Instructions (Addendum)
Give us 2-3 business days to get the results of your labs back.   Keep the diet clean and stay active.  Aim to do some physical exertion for 150 minutes per week. This is typically divided into 5 days per week, 30 minutes per day. The activity should be enough to get your heart rate up. Anything is better than nothing if you have time constraints.  Let us know if you need anything.  

## 2020-05-07 ENCOUNTER — Other Ambulatory Visit: Payer: Self-pay | Admitting: Family Medicine

## 2020-05-07 LAB — HEMOGLOBIN A1C
Hgb A1c MFr Bld: 6 % of total Hgb — ABNORMAL HIGH (ref <5.7)
Mean Plasma Glucose: 126 (calc)
eAG (mmol/L): 7 (calc)

## 2020-05-07 MED ORDER — PIOGLITAZONE HCL 30 MG PO TABS: 30.0000 mg | ORAL_TABLET | Freq: Every day | ORAL | 3 refills | Status: DC

## 2020-05-07 MED ORDER — LANTUS SOLOSTAR 100 UNIT/ML ~~LOC~~ SOPN: 15.0000 [IU] | PEN_INJECTOR | Freq: Every day | SUBCUTANEOUS | 3 refills | Status: DC

## 2020-08-10 ENCOUNTER — Ambulatory Visit: Payer: 59 | Admitting: Family Medicine

## 2020-08-24 ENCOUNTER — Ambulatory Visit: Payer: 59 | Admitting: Family Medicine

## 2020-10-30 ENCOUNTER — Other Ambulatory Visit: Payer: Self-pay | Admitting: Family Medicine

## 2020-11-15 NOTE — Telephone Encounter (Signed)
Patient is calling in reference to her glucose readings, patient states they are elevated . Patient would like to know how to bring glucose down   Please advise

## 2020-11-16 ENCOUNTER — Other Ambulatory Visit: Payer: Self-pay | Admitting: Family Medicine

## 2020-11-16 DIAGNOSIS — E1165 Type 2 diabetes mellitus with hyperglycemia: Secondary | ICD-10-CM

## 2020-11-16 MED ORDER — METFORMIN HCL ER 500 MG PO TB24: 500.0000 mg | ORAL_TABLET | Freq: Every day | ORAL | 2 refills | Status: DC

## 2020-11-18 ENCOUNTER — Ambulatory Visit: Payer: 59 | Admitting: Family Medicine

## 2020-11-22 ENCOUNTER — Ambulatory Visit: Payer: 59 | Admitting: Family Medicine

## 2020-11-22 DIAGNOSIS — Z0289 Encounter for other administrative examinations: Secondary | ICD-10-CM

## 2020-11-25 ENCOUNTER — Other Ambulatory Visit: Payer: Self-pay | Admitting: Family Medicine

## 2020-11-25 DIAGNOSIS — E1165 Type 2 diabetes mellitus with hyperglycemia: Secondary | ICD-10-CM

## 2020-11-25 MED ORDER — METFORMIN HCL ER 500 MG PO TB24: 500.0000 mg | ORAL_TABLET | Freq: Every day | ORAL | 2 refills | Status: DC

## 2020-12-20 ENCOUNTER — Encounter: Payer: Self-pay | Admitting: Family Medicine

## 2020-12-20 ENCOUNTER — Other Ambulatory Visit: Payer: Self-pay | Admitting: Family Medicine

## 2020-12-20 ENCOUNTER — Other Ambulatory Visit: Payer: Self-pay

## 2020-12-20 ENCOUNTER — Telehealth (INDEPENDENT_AMBULATORY_CARE_PROVIDER_SITE_OTHER): Payer: 59 | Admitting: Family Medicine

## 2020-12-20 DIAGNOSIS — E1165 Type 2 diabetes mellitus with hyperglycemia: Secondary | ICD-10-CM | POA: Diagnosis not present

## 2020-12-20 DIAGNOSIS — U071 COVID-19: Secondary | ICD-10-CM

## 2020-12-20 MED ORDER — LANTUS SOLOSTAR 100 UNIT/ML ~~LOC~~ SOPN: 32.0000 [IU] | PEN_INJECTOR | Freq: Every day | SUBCUTANEOUS | 3 refills | Status: DC

## 2020-12-20 MED ORDER — BENZONATATE 100 MG PO CAPS: 100.0000 mg | ORAL_CAPSULE | Freq: Three times a day (TID) | ORAL | 0 refills | Status: DC | PRN

## 2020-12-20 MED ORDER — METFORMIN HCL ER 500 MG PO TB24: 1000.0000 mg | ORAL_TABLET | Freq: Every day | ORAL | 2 refills | Status: DC

## 2020-12-20 MED ORDER — ONDANSETRON 4 MG PO TBDP: 4.0000 mg | ORAL_TABLET | Freq: Three times a day (TID) | ORAL | 0 refills | Status: DC | PRN

## 2020-12-20 NOTE — Progress Notes (Signed)
Chief Complaint  Patient presents with  . Covid Positive    Tested positive 2 weeks ago. Out of work 2 1/2 weeks.  . Nausea  . Heartburn  . Fatigue    Jenna Nichols here for URI complaints. Due to COVID-19 pandemic, we are interacting via web portal for an electronic face-to-face visit. I verified patient's ID using 2 identifiers. Patient agreed to proceed with visit via this method. Patient is at home, I am at office. Patient and I are present for visit.   Duration: 2 weeks  Associated symptoms: rhinorrhea and cough, low energy, nausea Denies: sinus congestion, sinus pain, itchy watery eyes, ear pain, ear drainage, sore throat, wheezing, shortness of breath, myalgia and fevers, V/D Treatment to date: none Sick contacts: Yes- sick contacts at home.   Tested + 2 weeks ago.  She is vaccinated, but has not been boosted.   Patient has a history of type 2 diabetes.  She is currently on 27 units of Lantus nightly in addition to 500 mg of Metformin XR daily and Actos 30 mg daily.  She is compliant with medications and reports no adverse effects.  Her sugars have been running in the 200s.  She only checks in the morning.  Diet has been fair.  No lows.  She has been drinking more water and urinating more frequently since her sugars have been elevated.  Past Medical History:  Diagnosis Date  . Anemia   . 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 Headache   . Hypertension   . Thyroid disease    post operative   Exam No conversational dyspnea Age appropriate judgment and insight Nml affect and mood  COVID-19 - Plan: benzonatate (TESSALON) 100 MG capsule, ondansetron (ZOFRAN-ODT) 4 MG disintegrating tablet  Type 2 diabetes mellitus with hyperglycemia, without long-term current use of insulin (HCC) - Plan: insulin glargine (LANTUS SOLOSTAR) 100 UNIT/ML Solostar Pen, metFORMIN (GLUCOPHAGE-XR) 500 MG 24 hr tablet  1. Continue to push fluids, practice good hand hygiene, cover mouth when coughing.   We will send in Zofran as needed for nausea and Tessalon Perles for cough.  Hopefully she does not have much longer to fully improve. F/u prn. If starting to experience replaceable fluid loss, shaking, or shortness of breath, seek immediate care. 2.  Increase Metformin XR from 500 mg to 1000 mg daily.  We will also increase her Lantus from 27 units nightly to 32 units nightly.  Monitor sugars throughout the day rather than just the morning.  I would like to see her in 2 weeks for this.  Pt voiced understanding and agreement to the plan.  [place], DO 12/20/20 11:31 AM

## 2020-12-21 ENCOUNTER — Telehealth: Payer: Self-pay | Admitting: Family Medicine

## 2020-12-21 DIAGNOSIS — E1165 Type 2 diabetes mellitus with hyperglycemia: Secondary | ICD-10-CM

## 2020-12-21 MED ORDER — LANTUS SOLOSTAR 100 UNIT/ML ~~LOC~~ SOPN: 35.0000 [IU] | PEN_INJECTOR | Freq: Every day | SUBCUTANEOUS | 3 refills | Status: DC

## 2020-12-21 NOTE — Telephone Encounter (Signed)
The daughter was aware of virtual appt 12/20/20 and increase in diabetic medication. Stated her sugar has been elevated over the past few months/300 to 400.  The daughter checked it last night and was 439. She is concerned///appt/ Ed or ? She is taking her meds as prescribed---the daughter did say the metformin makes her nauseas doing the increase.

## 2020-12-21 NOTE — Telephone Encounter (Signed)
Patient's daughter called in reference to patient's diabetes, per daughter she is concerned that her blood sugars are not under control. Patient daughter would like a call back    Please advise

## 2020-12-21 NOTE — Telephone Encounter (Signed)
Send me some readings Monday. We can increase another 3 units to 35 units nightly. Can't safely go up to quickly. An alternative to taking both metformin tabs at once is 1 in the morning and 1 in the evening. I don't think she needs to go to ER at this point unless she gets more lethargic or can't keep up with fluid losses.

## 2020-12-21 NOTE — Telephone Encounter (Signed)
Called the daughter left message to call back.

## 2020-12-21 NOTE — Telephone Encounter (Signed)
Called the daughter informed of PCP instructions. She verbalized understanding.

## 2020-12-25 ENCOUNTER — Other Ambulatory Visit: Payer: Self-pay

## 2020-12-26 MED ORDER — PIOGLITAZONE HCL 30 MG PO TABS: 30.0000 mg | ORAL_TABLET | Freq: Every day | ORAL | 0 refills | Status: DC

## 2021-01-02 ENCOUNTER — Encounter: Payer: Self-pay | Admitting: Family Medicine

## 2021-01-23 ENCOUNTER — Telehealth: Payer: Self-pay | Admitting: Family Medicine

## 2021-01-23 NOTE — Telephone Encounter (Signed)
Medication: atorvastatin (LIPITOR) 40 MG tablet [[id number]]       Has the patient contacted their pharmacy?  (If no, request that the patient contact the pharmacy for the refill.) (If yes, when and what did the pharmacy advise?)     Preferred Pharmacy (with phone number or street name): [facility] [place], [place] - [address] [place] [address]  Phone:  [phone] Fax:  [phone]      Agent: Please be advised that RX refills may take up to 3 business days. We ask that you follow-up with your pharmacy.

## 2021-01-24 MED ORDER — ATORVASTATIN CALCIUM 40 MG PO TABS: 40.0000 mg | ORAL_TABLET | Freq: Every day | ORAL | 2 refills | Status: DC

## 2021-01-24 NOTE — Telephone Encounter (Signed)
Rx sent 

## 2021-03-08 IMAGING — MG DIGITAL SCREENING BILAT W/ TOMO W/ CAD
8 series · 8 of 24 positions shown · non-contrast
Comparison: Previous exam(s).

CLINICAL DATA: Screening.

EXAM:
DIGITAL SCREENING BILATERAL MAMMOGRAM WITH TOMO AND CAD

[R CC synth-2D]
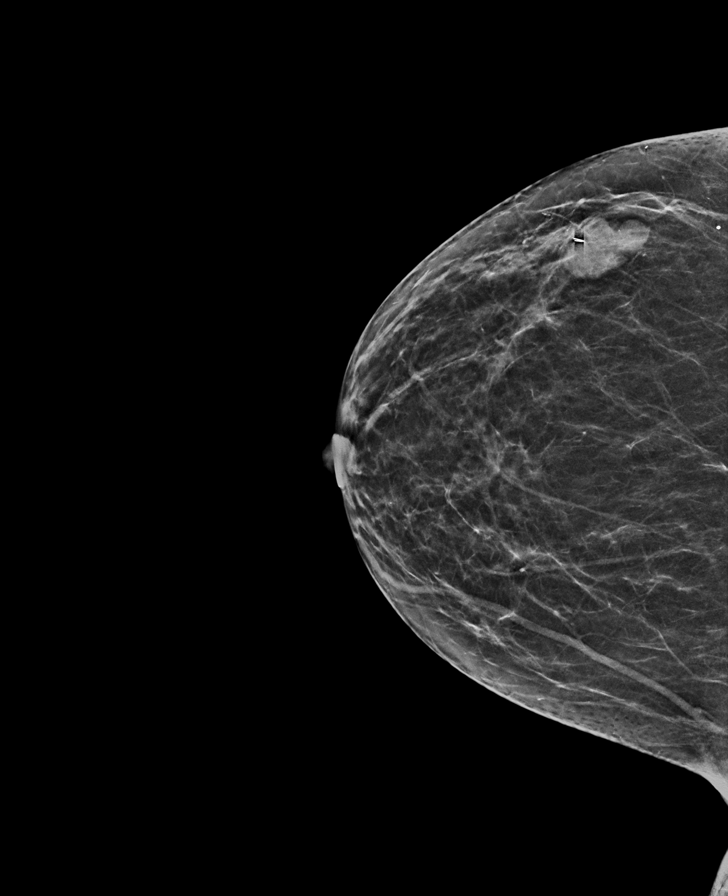

[L CC synth-2D]
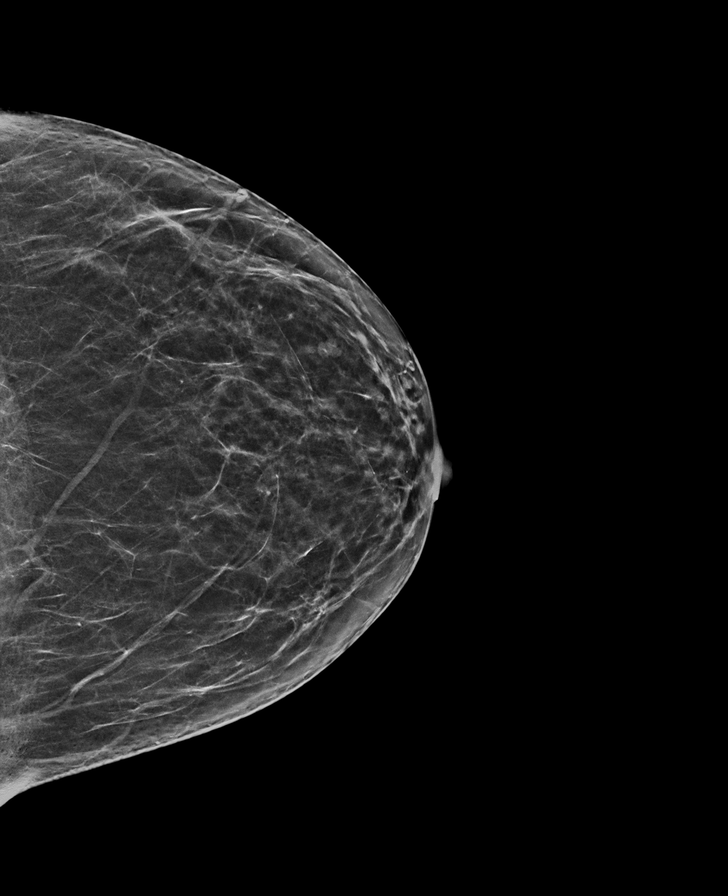

[L MLO synth-2D]
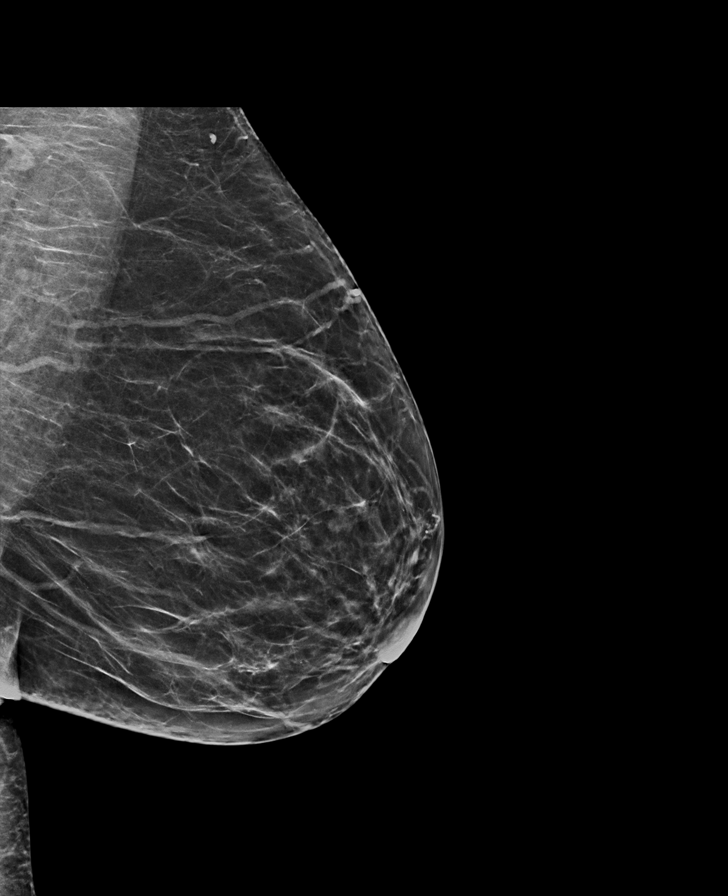

[R MLO synth-2D]
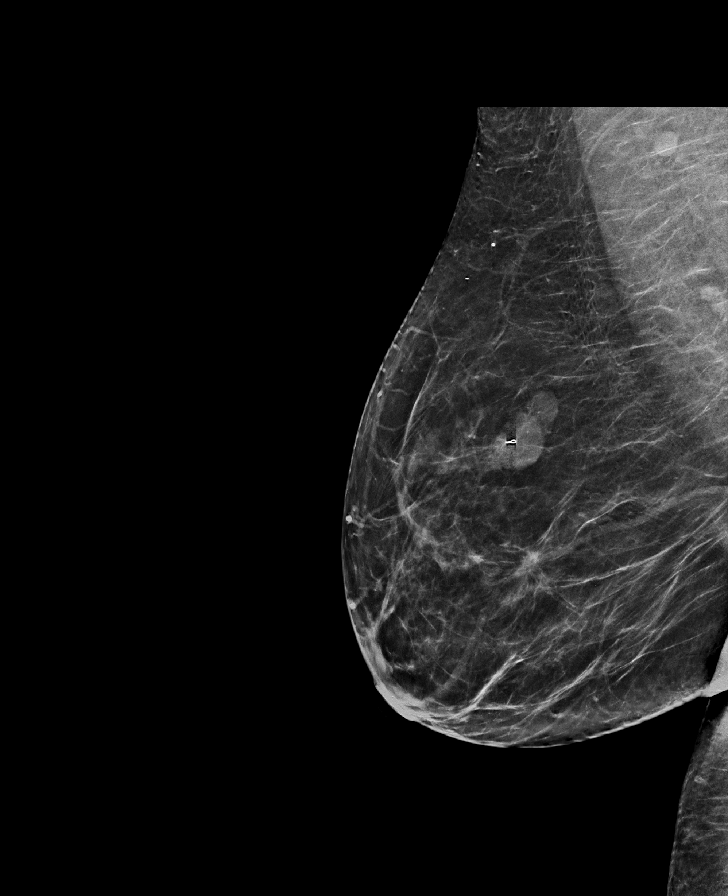

[R CC tomo · tomo slice 27/54.0]
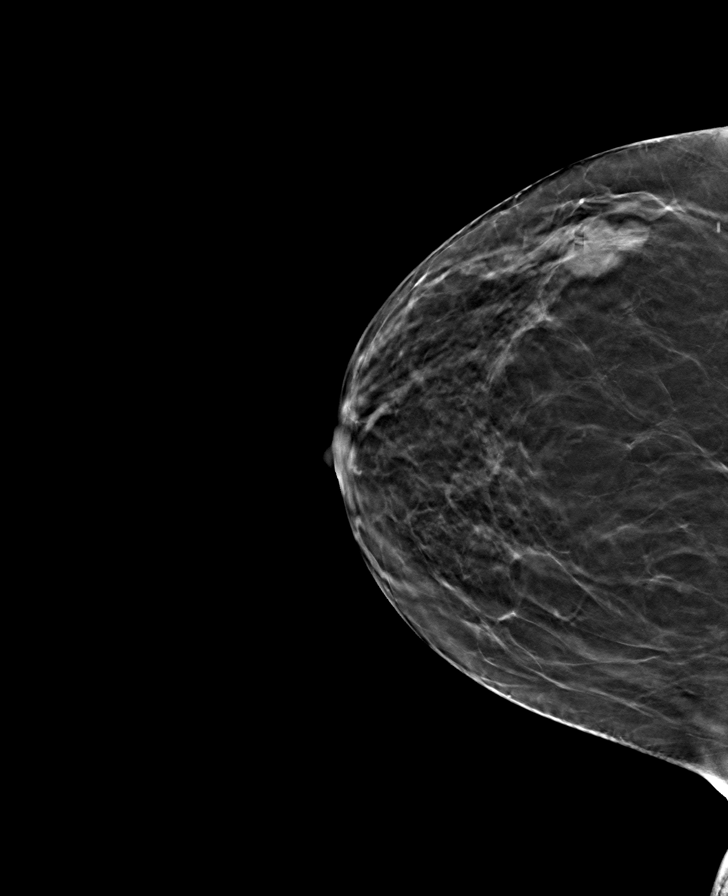

[L CC tomo · tomo slice 29/57.0]
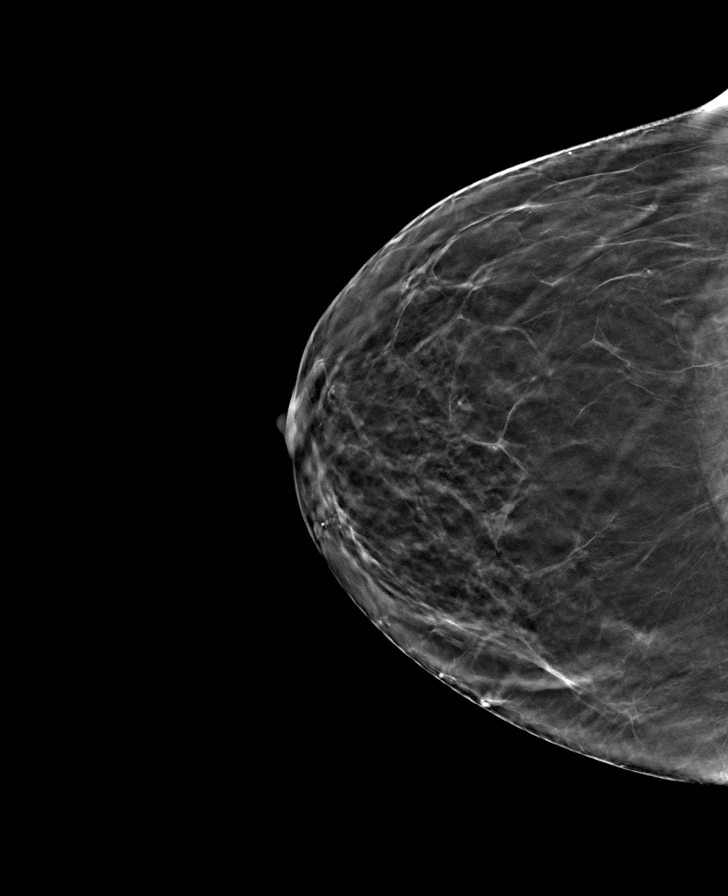

[L MLO tomo · tomo slice 33/65.0]
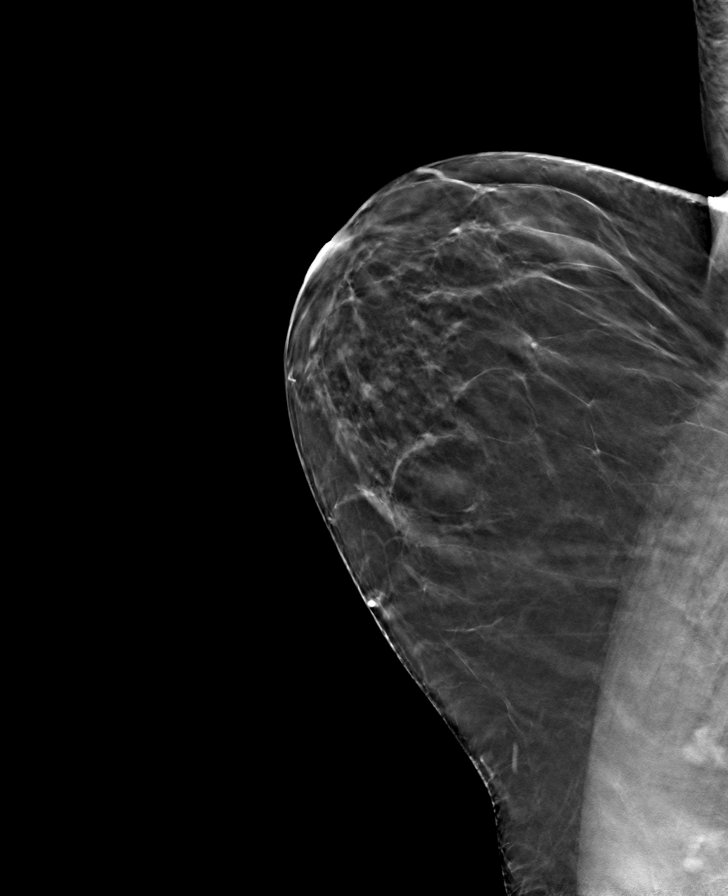

[R MLO tomo · tomo slice 35/69.0]
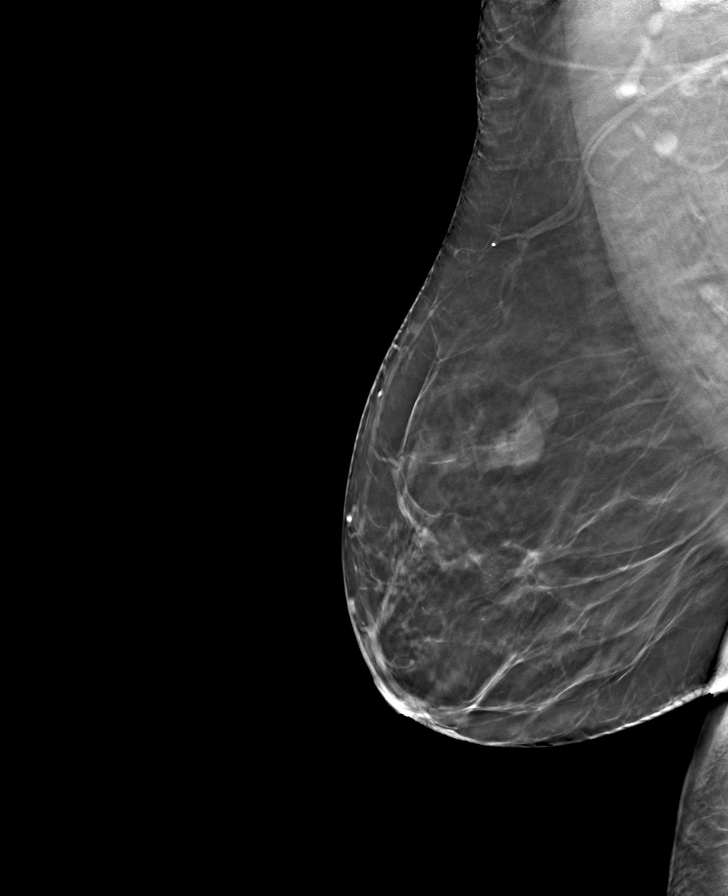

[8 of 24 positions shown; findings below may reference images not displayed]

ACR Breast Density Category b: There are scattered areas of
fibroglandular density.
FINDINGS: There are no findings suspicious for malignancy. Images were
processed with CAD.
IMPRESSION: No mammographic evidence of malignancy. A result letter of this
screening mammogram will be mailed directly to the patient.

RECOMMENDATION:
Screening mammogram in one year. (Code:[id number])

BI-RADS CATEGORY  1: Negative.

## 2021-03-21 ENCOUNTER — Other Ambulatory Visit: Payer: Self-pay | Admitting: Family Medicine

## 2021-03-21 DIAGNOSIS — E1165 Type 2 diabetes mellitus with hyperglycemia: Secondary | ICD-10-CM

## 2021-04-03 ENCOUNTER — Other Ambulatory Visit: Payer: Self-pay | Admitting: Family Medicine

## 2021-04-03 DIAGNOSIS — E1165 Type 2 diabetes mellitus with hyperglycemia: Secondary | ICD-10-CM

## 2021-04-03 MED ORDER — LANTUS SOLOSTAR 100 UNIT/ML ~~LOC~~ SOPN: PEN_INJECTOR | SUBCUTANEOUS | 6 refills | Status: DC

## 2021-04-20 ENCOUNTER — Other Ambulatory Visit: Payer: Self-pay | Admitting: Family Medicine

## 2021-04-30 ENCOUNTER — Other Ambulatory Visit: Payer: Self-pay | Admitting: Family Medicine

## 2021-04-30 DIAGNOSIS — E1165 Type 2 diabetes mellitus with hyperglycemia: Secondary | ICD-10-CM

## 2021-05-25 ENCOUNTER — Other Ambulatory Visit: Payer: Self-pay | Admitting: Family Medicine

## 2021-05-29 ENCOUNTER — Encounter: Payer: Self-pay | Admitting: Family Medicine

## 2021-05-29 ENCOUNTER — Encounter: Payer: 59 | Admitting: Family Medicine

## 2021-05-30 ENCOUNTER — Other Ambulatory Visit: Payer: Self-pay | Admitting: Family Medicine

## 2021-05-30 DIAGNOSIS — E1165 Type 2 diabetes mellitus with hyperglycemia: Secondary | ICD-10-CM

## 2021-06-06 ENCOUNTER — Other Ambulatory Visit: Payer: Self-pay

## 2021-06-06 ENCOUNTER — Encounter: Payer: Self-pay | Admitting: Family Medicine

## 2021-06-06 ENCOUNTER — Ambulatory Visit (INDEPENDENT_AMBULATORY_CARE_PROVIDER_SITE_OTHER): Payer: 59 | Admitting: Family Medicine

## 2021-06-06 VITALS — BP 120/78 | HR 88 | Temp 98.0°F | Ht 67.0 in | Wt 161.5 lb

## 2021-06-06 DIAGNOSIS — E1165 Type 2 diabetes mellitus with hyperglycemia: Secondary | ICD-10-CM

## 2021-06-06 DIAGNOSIS — Z1211 Encounter for screening for malignant neoplasm of colon: Secondary | ICD-10-CM | POA: Diagnosis not present

## 2021-06-06 DIAGNOSIS — Z794 Long term (current) use of insulin: Secondary | ICD-10-CM | POA: Diagnosis not present

## 2021-06-06 DIAGNOSIS — Z0000 Encounter for general adult medical examination without abnormal findings: Secondary | ICD-10-CM | POA: Diagnosis not present

## 2021-06-06 MED ORDER — KETOCONAZOLE 2 % EX CREA: 1.0000 "application " | TOPICAL_CREAM | Freq: Every day | CUTANEOUS | 0 refills | Status: AC

## 2021-06-06 MED ORDER — FREESTYLE SYSTEM KIT: PACK | 0 refills | Status: AC

## 2021-06-06 MED ORDER — GLUCOSE BLOOD VI STRP: ORAL_STRIP | 6 refills | Status: AC

## 2021-06-06 MED ORDER — FREESTYLE LANCETS MISC: 6 refills | Status: AC

## 2021-06-06 MED ORDER — DAPAGLIFLOZIN PROPANEDIOL 10 MG PO TABS: 10.0000 mg | ORAL_TABLET | Freq: Every day | ORAL | 2 refills | Status: DC

## 2021-06-06 NOTE — Patient Instructions (Addendum)
Give [place] 2-3 business days to get the results of your labs back.   Keep the diet clean and stay active.  Let me know if there cost issues with the new medicine.  If you do not hear anything about your referrals in the next 1-2 weeks, call our office and ask for an update.  Consider soy or black cohosh to help with hot flashes.  Let us know if you need anything.

## 2021-06-06 NOTE — Progress Notes (Signed)
Chief Complaint  Patient presents with   Annual Exam     Well Woman Jenna Nichols is here for a complete physical.   Her last physical was >1 year ago.  Current diet: in general, a "healthy" diet. Current exercise: walking, active at work. Weight is stable and she denies fatigue out of ordinary. Seatbelt? Yes  Health Maintenance Mammogram- Yes Colon cancer screening-No Shingrix- No Tetanus- Yes Hep C screening- Yes HIV screening- Yes  DM II Patient checks sugars intermittently.  They usually range from high double figures to 300s depending on what she eats.  She is compliant with Lantus 35 units at night and 27 units in the morning.  She also takes metformin XR 1000 mg twice daily and Actos 30 mg daily.  No side effects.  Diet and exercise as above.  She is not having hypoglycemic episodes.  She has greatly decreased her carbohydrate intake and lost weight.  She is frustrated that she is not sure what she should be eating.  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   Headache    Hypertension    Thyroid disease    post operative     Past Surgical History:  Procedure Laterality Date   ABDOMINAL HYSTERECTOMY     CYSTOSCOPY N/A 06/17/2018   Procedure: CYSTOSCOPY;  Surgeon: Lavonia Drafts, MD;  Location: [facility];  Service: Gynecology;  Laterality: N/A;   OOPHORECTOMY     ROBOTIC ASSISTED TOTAL HYSTERECTOMY WITH BILATERAL SALPINGO OOPHERECTOMY Bilateral 06/17/2018   Procedure: XI ROBOTIC ASSISTED TOTAL HYSTERECTOMY WITH BILATERAL SALPINGECTOMY AND RIGHT OOPHORECTOMY;  Surgeon: Lavonia Drafts, MD;  Location: [facility];  Service: Gynecology;  Laterality: Bilateral;   THYROIDECTOMY     TUBAL LIGATION      Medications  Current Outpatient Medications on File Prior to Visit  Medication Sig Dispense Refill   atorvastatin (LIPITOR) 40 MG tablet Take 1 tablet (40 mg total) by mouth daily. 90 tablet 2   Blood Glucose Monitoring  Suppl (ONE TOUCH ULTRA 2) w/Device KIT Use once daily to check blood sugar.  DX E11.9 1 kit 0   glucose blood (ONETOUCH ULTRA) test strip Use once daily to check blood sugar. DX E11.9 100 each 3   insulin glargine (LANTUS SOLOSTAR) 100 UNIT/ML Solostar Pen INJECT 27 UNITS SUBCUTANEOUSLY ONCE DAILY (Patient taking differently: 35 Units. INJECT 27 UNITS SUBCUTANEOUSLY ONCE DAILY) 15 mL 6   Lancets (ONETOUCH ULTRASOFT) lancets Use once daily to check blood sugar. DX E11.9 100 each 3   levothyroxine (SYNTHROID) 137 MCG tablet Take 137 mcg by mouth daily before breakfast.      losartan (COZAAR) 50 MG tablet TAKE 1 TABLET BY MOUTH ONCE DAILY . APPOINTMENT REQUIRED FOR FUTURE REFILLS 30 tablet 0   metFORMIN (GLUCOPHAGE-XR) 500 MG 24 hr tablet TAKE 2 TABLETS BY MOUTH ONCE DAILY WITH BREAKFAST 60 tablet 0   pioglitazone (ACTOS) 30 MG tablet Take 1 tablet (30 mg total) by mouth daily. (Patient not taking: Reported on 06/06/2021) 30 tablet 0   Allergies No Known Allergies  Review of Systems: Constitutional:  no unexpected weight changes Eye:  no recent significant change in vision Ear/Nose/Mouth/Throat:  Ears:  no recent change in hearing Nose/Mouth/Throat:  no complaints of nasal congestion, no sore throat Cardiovascular: no chest pain Respiratory:  no shortness of breath Gastrointestinal:  no abdominal pain, no change in bowel habits GU:  Female: negative for dysuria or pelvic pain  Musculoskeletal/Extremities:  no pain of the joints Integumentary (Skin/Breast):  no abnormal skin lesions reported Neurologic:  no headaches Endocrine:  denies fatigue  Exam BP 120/78   Pulse 88   Temp 98 F (36.7 C) (Oral)   Ht _0  (1.702 m)   Wt 161 lb 8 oz (73.3 kg)   LMP 04/19/2018 (Exact Date)   SpO2 99%   BMI 25.29 kg/m  General:  well developed, well nourished, in no apparent distress Skin:  no significant moles, warts, or growths Head:  no masses, lesions, or tenderness Eyes:  pupils equal and  round, sclera anicteric without injection Ears:  canals without lesions, TMs shiny without retraction, no obvious effusion, no erythema Nose:  nares patent, septum midline, mucosa normal, and no drainage or sinus tenderness Throat/Pharynx:  lips and gingiva without lesion; tongue and uvula midline; non-inflamed pharynx; no exudates or postnasal drainage Neck: neck supple without adenopathy, thyromegaly, or masses Lungs:  clear to auscultation, breath sounds equal bilaterally, no respiratory distress Cardio:  regular rate and rhythm, no LE edema Abdomen:  abdomen soft, nontender; bowel sounds normal; no masses or organomegaly Genital: Defer to GYN Musculoskeletal:  symmetrical muscle groups noted without atrophy or deformity Extremities:  no clubbing, cyanosis, or edema, no deformities, no skin discoloration Neuro:  gait normal; deep tendon reflexes normal and symmetric; sensation intact to pinprick in both feet Psych: well oriented with normal range of affect and appropriate judgment/insight  Assessment and Plan  Well adult exam  Type 2 diabetes mellitus with hyperglycemia, with long-term current use of insulin (HCC) - Plan: CBC, Comprehensive metabolic panel, Lipid panel, Hemoglobin A1c, Microalbumin / creatinine urine ratio, Ambulatory referral to Ophthalmology, dapagliflozin propanediol (FARXIGA) 10 MG TABS tablet, Amb ref to Medical Nutrition Therapy-MNT  Screen for colon cancer - Plan: Ambulatory referral to Gastroenterology   Well 50 y.o. female. Counseled on diet and exercise. Diabetes: Chronic, uncontrolled.  Continue Lantus 35 units at night and 27 units in the morning, metformin XR 1000 mg twice daily, Actos 30 mg daily.  We will see if her insurance covers [place] as I think her insulin insensitivity is evidence with her fluctuations.  We will also set her up with a dietitian.  We will set her up with a new eye doctor. Other orders as above. Shingles vaccine recommended. Follow  up pending above results. The patient voiced understanding and agreement to the plan.  [place], DO 06/06/21 4:04 PM

## 2021-06-07 ENCOUNTER — Other Ambulatory Visit: Payer: Self-pay | Admitting: Family Medicine

## 2021-06-07 LAB — COMPREHENSIVE METABOLIC PANEL
ALT: 13 U/L (ref 0–35)
AST: 16 U/L (ref 0–37)
Albumin: 4.4 g/dL (ref 3.5–5.2)
Alkaline Phosphatase: 74 U/L (ref 39–117)
BUN: 13 mg/dL (ref 6–23)
CO2: 30 mEq/L (ref 19–32)
Calcium: 9.4 mg/dL (ref 8.4–10.5)
Chloride: 103 mEq/L (ref 96–112)
Creatinine, Ser: 0.7 mg/dL (ref 0.40–1.20)
GFR: 99.93 mL/min (ref >60.00)
Glucose, Bld: 156 mg/dL — ABNORMAL HIGH (ref 70–99)
Potassium: 4.5 mEq/L (ref 3.5–5.1)
Sodium: 140 mEq/L (ref 135–145)
Total Bilirubin: 0.7 mg/dL (ref 0.2–1.2)
Total Protein: 7.4 g/dL (ref 6.0–8.3)

## 2021-06-07 LAB — LIPID PANEL
Cholesterol: 137 mg/dL (ref 0–200)
HDL: 63.7 mg/dL (ref >39.00)
LDL Cholesterol: 65 mg/dL (ref 0–99)
NonHDL: 73.17
Total CHOL/HDL Ratio: 2
Triglycerides: 39 mg/dL (ref 0.0–149.0)
VLDL: 7.8 mg/dL (ref 0.0–40.0)

## 2021-06-07 LAB — CBC
HCT: 41.9 % (ref 36.0–46.0)
Hemoglobin: 13.6 g/dL (ref 12.0–15.0)
MCHC: 32.4 g/dL (ref 30.0–36.0)
MCV: 93.1 fl (ref 78.0–100.0)
Platelets: 265 10*3/uL (ref 150.0–400.0)
RBC: 4.5 Mil/uL (ref 3.87–5.11)
RDW: 14.6 % (ref 11.5–15.5)
WBC: 4.8 10*3/uL (ref 4.0–10.5)

## 2021-06-07 LAB — HEMOGLOBIN A1C: Hgb A1c MFr Bld: 8.9 % — ABNORMAL HIGH (ref 4.6–6.5)

## 2021-06-07 LAB — MICROALBUMIN / CREATININE URINE RATIO
Creatinine,U: 209 mg/dL
Microalb Creat Ratio: 1.6 mg/g (ref 0.0–30.0)
Microalb, Ur: 3.3 mg/dL — ABNORMAL HIGH (ref 0.0–1.9)

## 2021-06-07 MED ORDER — PIOGLITAZONE HCL 30 MG PO TABS: 30.0000 mg | ORAL_TABLET | Freq: Every day | ORAL | 0 refills | Status: DC

## 2021-06-21 ENCOUNTER — Telehealth: Payer: Self-pay | Admitting: Family Medicine

## 2021-06-21 DIAGNOSIS — E1165 Type 2 diabetes mellitus with hyperglycemia: Secondary | ICD-10-CM

## 2021-06-21 NOTE — Telephone Encounter (Signed)
Insurance denied coverage of Jenna Nichols They will cover Onglyza PCP sent in Onglyza 5 mg daily. Called the patient to inform--had to leave a message to call back.

## 2021-06-23 MED ORDER — LANTUS SOLOSTAR 100 UNIT/ML ~~LOC~~ SOPN: PEN_INJECTOR | SUBCUTANEOUS | 6 refills | Status: DC

## 2021-06-23 MED ORDER — SAXAGLIPTIN HCL 5 MG PO TABS: 5.0000 mg | ORAL_TABLET | Freq: Every day | ORAL | 3 refills | Status: DC

## 2021-06-23 NOTE — Telephone Encounter (Signed)
Patient informed and verbalized understanding

## 2021-06-23 NOTE — Telephone Encounter (Signed)
Patient informed of change. She voiced understanding.

## 2021-06-23 NOTE — Telephone Encounter (Signed)
For now, let's have her take 27 units in the evening, keep sugar log and we'll see her at the end of Aug. Ty.

## 2021-06-23 NOTE — Telephone Encounter (Signed)
Patient has lowered your insulin 15 units at night not realizing that she by mistake had read last years 04/2020 instructions. She now is saying she has only been taking insulin at night and not doing insulin in the mornings. When you wake in the mornings it has been running high around 200 This has been the past few days. The patient is very confused at this point. I did go over her last OV/lab instructions with her.

## 2021-06-23 NOTE — Addendum Note (Signed)
Addended by: Ames Coupe on: 06/23/2021 04:34 PM   Modules accepted: Orders

## 2021-06-30 ENCOUNTER — Other Ambulatory Visit: Payer: Self-pay | Admitting: Family Medicine

## 2021-07-03 ENCOUNTER — Other Ambulatory Visit: Payer: Self-pay | Admitting: Family Medicine

## 2021-07-03 DIAGNOSIS — E1165 Type 2 diabetes mellitus with hyperglycemia: Secondary | ICD-10-CM

## 2021-07-10 ENCOUNTER — Other Ambulatory Visit: Payer: Self-pay

## 2021-07-10 ENCOUNTER — Ambulatory Visit: Payer: 59 | Admitting: Family Medicine

## 2021-07-10 ENCOUNTER — Encounter: Payer: Self-pay | Admitting: Family Medicine

## 2021-07-10 VITALS — BP 124/69 | HR 89 | Temp 98.0°F | Ht 67.0 in | Wt 160.0 lb

## 2021-07-10 DIAGNOSIS — Z794 Long term (current) use of insulin: Secondary | ICD-10-CM

## 2021-07-10 DIAGNOSIS — E1165 Type 2 diabetes mellitus with hyperglycemia: Secondary | ICD-10-CM

## 2021-07-10 MED ORDER — LANTUS SOLOSTAR 100 UNIT/ML ~~LOC~~ SOPN: PEN_INJECTOR | SUBCUTANEOUS | 6 refills | Status: AC

## 2021-07-10 MED ORDER — ATORVASTATIN CALCIUM 40 MG PO TABS: 40.0000 mg | ORAL_TABLET | Freq: Every day | ORAL | 2 refills | Status: DC

## 2021-07-10 MED ORDER — METFORMIN HCL ER 500 MG PO TB24: 1000.0000 mg | ORAL_TABLET | Freq: Two times a day (BID) | ORAL | 0 refills | Status: DC

## 2021-07-10 NOTE — Patient Instructions (Addendum)
Keep the diet clean and stay active.  Aim to do some physical exertion for 150 minutes per week. This is typically divided into 5 days per week, 30 minutes per day. The activity should be enough to get your heart rate up. Anything is better than nothing if you have time constraints.  Start taking 33 units of Lantus nightly for 3 days. If you feel good and we are not to 150 morning sugar readings, increase by 3 units to 36 units nightly. Continue this increase every 3 days until you get to 45 units nightly. Once that happens, send me some readings on MyChart.   We are going to start taking 2 tabs of Metformin twice daily now.   Keep checking your sugars please.   Let us know if you need anything.

## 2021-07-10 NOTE — Progress Notes (Signed)
Chief Complaint  Patient presents with   Follow-up    diabetes    Subjective: Patient is a 50 y.o. female here for f/u DM.  Patient had a physical 1 month ago.  A1c was 8.9, up from 6.0 the year before.  She is compliant with metformin XR 500 mg twice daily, Lantus 27 units daily, Actos 30 mg daily, Onglyza 5 mg daily.  Sugars have been running at 200-300 in the morning and 300-400's non-fasting.  Diet is fair.  She is walking.  No hypoglycemia, chest pain, or shortness of breath.  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 Increase metformin to 1000 mg bid. Increase insulin to 33 u qhs, increase 3 u per 3 d until 45 u qhs and send message on MyChart. F/u in 1 mo.  The patient voiced understanding and agreement to the plan.  [place], DO 07/10/21  1:58 PM

## 2021-07-28 ENCOUNTER — Encounter: Payer: 59 | Attending: Family Medicine | Admitting: Dietician

## 2021-08-07 ENCOUNTER — Other Ambulatory Visit: Payer: Self-pay

## 2021-08-07 ENCOUNTER — Ambulatory Visit (INDEPENDENT_AMBULATORY_CARE_PROVIDER_SITE_OTHER): Payer: 59 | Admitting: Family Medicine

## 2021-08-07 ENCOUNTER — Encounter: Payer: Self-pay | Admitting: Family Medicine

## 2021-08-07 VITALS — BP 132/84 | HR 100 | Temp 98.3°F | Ht 67.0 in | Wt 156.0 lb

## 2021-08-07 DIAGNOSIS — Z794 Long term (current) use of insulin: Secondary | ICD-10-CM

## 2021-08-07 DIAGNOSIS — E1165 Type 2 diabetes mellitus with hyperglycemia: Secondary | ICD-10-CM

## 2021-08-07 DIAGNOSIS — Z1211 Encounter for screening for malignant neoplasm of colon: Secondary | ICD-10-CM

## 2021-08-07 MED ORDER — OZEMPIC (0.25 OR 0.5 MG/DOSE) 2 MG/1.5ML ~~LOC~~ SOPN: PEN_INJECTOR | SUBCUTANEOUS | 1 refills | Status: DC

## 2021-08-07 MED ORDER — LOSARTAN POTASSIUM 50 MG PO TABS: ORAL_TABLET | ORAL | 2 refills | Status: DC

## 2021-08-07 MED ORDER — PIOGLITAZONE HCL 30 MG PO TABS: 30.0000 mg | ORAL_TABLET | Freq: Every day | ORAL | 2 refills | Status: DC

## 2021-08-07 NOTE — Progress Notes (Signed)
Subjective:   Chief Complaint  Patient presents with   Follow-up    Jenna Nichols is a 50 y.o. female here for follow-up of diabetes.   [name]'s self monitored glucose range is 90-low 100's. Usually checks in the morning Patient denies hypoglycemic reactions. She checks her glucose levels 2 time(s) per day. Patient does require insulin. Lantus 33 u qhs.  Medications include: Metformin XR 500 mg bid, Actos 30 mg/d.  Diet is fair.  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   Headache    Hypertension    Thyroid disease    post operative   Type 2 diabetes mellitus with hyperglycemia (HCC)      Related testing: Retinal exam: Don Pneumovax: done  Objective:  BP 132/84   Pulse 100   Temp 98.3 F (36.8 C) (Oral)   Ht 5\' 7"  (1.702 m)   Wt 156 lb (70.8 kg)   LMP 04/19/2018 (Exact Date)   SpO2 98%   BMI 24.43 kg/m  General:  Well developed, well nourished, in no apparent distress Skin:  Warm, no pallor or diaphoresis Lungs:  CTAB, no access msc use Cardio:  RRR, no bruits, no LE edema Psych: Age appropriate judgment and insight  Assessment:   Type 2 diabetes mellitus with hyperglycemia, with long-term current use of insulin ([place]) - Plan: Ambulatory referral to Endocrinology  Screen for colon cancer   Plan:   Chronic, uncontrolled. Would recommend she start checking her sugars in the evenings and afternoons.  Increase metformin from 500 mg twice daily to 1000 mg twice daily.  Continue Actos 30 mg daily, continue Lantus 32 units nightly for now.  Counseled on diet and exercise.  She sees an endocrinologist for hypothyroidism, requesting a referral to him as he is very conveniently located from her house. F/u in 6 mo. The patient voiced understanding and agreement to the plan.  [place], DO 08/07/21 4:27 PM

## 2021-08-07 NOTE — Patient Instructions (Addendum)
If you do not hear anything about your referral in the next 1-2 weeks, call our office and ask for an update.  Keep the diet clean and stay active.  Take 2 tabs of the metformin twice daily.   Monitor afternoon and evening sugar levels.   Let us know if you need anything.

## 2021-08-09 ENCOUNTER — Other Ambulatory Visit: Payer: Self-pay | Admitting: Family Medicine

## 2021-08-09 DIAGNOSIS — E1165 Type 2 diabetes mellitus with hyperglycemia: Secondary | ICD-10-CM

## 2021-11-28 ENCOUNTER — Encounter: Payer: Self-pay | Admitting: Family Medicine

## 2021-11-28 ENCOUNTER — Ambulatory Visit (INDEPENDENT_AMBULATORY_CARE_PROVIDER_SITE_OTHER): Payer: 59 | Admitting: Family Medicine

## 2021-11-28 VITALS — BP 108/72 | HR 94 | Temp 97.0°F | Ht 67.5 in | Wt 146.1 lb

## 2021-11-28 DIAGNOSIS — M21372 Foot drop, left foot: Secondary | ICD-10-CM | POA: Diagnosis not present

## 2021-11-28 NOTE — Progress Notes (Signed)
Chief Complaint  Patient presents with   left foot numbness    Subjective: Patient is a 51 y.o. female here for L foot numbness.  2.5 weeks ago, started having L foot numbness and weakness with dorsiflexing her L foot. No change in activity or injury. Sometimes her foot does not pick up.   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 4/5 strength with L foot dorsiflexion, 5/5 strength with R foot dorsiflexion MSK: No ttp over the L fib head Lungs: No accessory muscle use Psych: Age appropriate judgment and insight, normal affect and mood  Assessment and Plan: Left foot drop - Plan: Ambulatory referral to Physical Therapy  Reassurance. Unsure how this happened. Fib head seemed to be moving well today.  F/u as originally scheduled.  The patient voiced understanding and agreement to the plan.  [place], DO 11/28/21  3:32 PM

## 2021-11-28 NOTE — Patient Instructions (Signed)
If you do not hear anything about your referral in the next 1-2 weeks, call our office and ask for an update.  Be extra cognizant of your walking.   Let us know if you need anything.

## 2022-06-28 ENCOUNTER — Other Ambulatory Visit: Payer: Self-pay | Admitting: Family Medicine

## 2022-08-15 ENCOUNTER — Other Ambulatory Visit: Payer: Self-pay | Admitting: Family Medicine

## 2022-08-22 ENCOUNTER — Other Ambulatory Visit: Payer: Self-pay | Admitting: Family Medicine

## 2022-09-21 ENCOUNTER — Other Ambulatory Visit: Payer: Self-pay | Admitting: Family Medicine

## 2022-09-25 NOTE — Telephone Encounter (Signed)
Error

## 2022-10-16 NOTE — Telephone Encounter (Signed)
Error

## 2022-10-19 ENCOUNTER — Other Ambulatory Visit: Payer: Self-pay | Admitting: Family Medicine

## 2022-10-19 MED ORDER — OZEMPIC (0.25 OR 0.5 MG/DOSE) 2 MG/1.5ML ~~LOC~~ SOPN: PEN_INJECTOR | SUBCUTANEOUS | 3 refills | Status: AC

## 2022-11-07 NOTE — Telephone Encounter (Signed)
ERROR

## 2022-11-30 NOTE — Telephone Encounter (Signed)
Error

## 2024-06-08 ENCOUNTER — Ambulatory Visit: Payer: Self-pay

## 2024-06-08 ENCOUNTER — Ambulatory Visit: Admitting: Family Medicine

## 2024-06-08 NOTE — Telephone Encounter (Signed)
 FYI Only or Action Required?: FYI only for provider.  Patient was last seen in primary care on 11/28/2021 by Frann Mabel Mt, DO.  Called Nurse Triage reporting No chief complaint on file..  Symptoms began several days ago.  Interventions attempted: Rest, hydration, or home remedies.  Symptoms are: stable.  Triage Disposition: See HCP Within 4 Hours (Or PCP Triage)  Patient/caregiver understands and will follow disposition?: Yes  Copied from CRM [id number]. Topic: Clinical - Red Word Triage >> Jun 08, 2024 12:02 PM [place] E wrote: [facility] that prompted transfer to Nurse Triage: Patient passed out for about 10 seconds this past Saturday, daughter was able to catch her so patient did not hit a body part. Reason for Disposition  [1] All other patients AND [2] now alert and feels fine  (Exception: SIMPLE FAINT due to stress, pain, prolonged standing, or suddenly standing)  Answer Assessment - Initial Assessment Questions 1. ONSET: How long were you unconscious? (e.g., minutes, seconds) When did it happen?     10 seconds, this Past Saturday  2. CONTENT: What happened during the period of unconsciousness? (e.g., seizure activity)      Nothing reported by patient or daughter.   3. MENTAL STATUS: Alert and oriented now? (e.g., oriented x 3 = name, month, location)      Oriented x 3  4. TRIGGER: What do you think caused the fainting? What were you doing just before you fainted?  (e.g., exercise, sudden standing up, prolonged standing)     Standing outside at the back of her car, sudden onset of lightheadedness   5. RECURRENT SYMPTOM: Have you ever passed out before? If Yes, ask: When was the last time? and What happened that time?      No  6. INJURY: Did you hurt yourself when you fell?      No, Daughter caught the patient  7. CARDIAC SYMPTOMS: Have you had any of the following symptoms: chest pain, difficulty breathing, palpitations?     Denies  8.  NEUROLOGIC SYMPTOMS: Have you had any of the following symptoms: headache, numbness, vertigo, weakness?     Denies  9. GI SYMPTOMS: Have you had any of the following symptoms: abdomen pain, vomiting, diarrhea, blood in stools?     Denies  10. OTHER SYMPTOMS: Do you have any other symptoms?       Denies  11. PREGNANCY: Is there any chance you are pregnant? When was your last menstrual period?       No and No  Protocols used: Fainting-A-AH
